# Patient Record
Sex: Female | Born: 1980 | Race: Black or African American | Hispanic: No | Marital: Married | Smoking: Never smoker
Health system: Southern US, Community
[De-identification: ages and names within clinical notes are randomized; demographics above are authoritative.]

## PROBLEM LIST (undated history)

## (undated) DIAGNOSIS — K219 Gastro-esophageal reflux disease without esophagitis: Secondary | ICD-10-CM

## (undated) DIAGNOSIS — I471 Supraventricular tachycardia, unspecified: Secondary | ICD-10-CM

## (undated) DIAGNOSIS — N83209 Unspecified ovarian cyst, unspecified side: Secondary | ICD-10-CM

## (undated) HISTORY — DX: Gastro-esophageal reflux disease without esophagitis: K21.9

---

## 2016-06-23 ENCOUNTER — Encounter (HOSPITAL_COMMUNITY): Payer: Self-pay | Admitting: Emergency Medicine

## 2016-06-23 ENCOUNTER — Emergency Department (HOSPITAL_COMMUNITY)
Admission: EM | Admit: 2016-06-23 | Discharge: 2016-06-23 | Disposition: A | Discharge status: Home / Self Care | Payer: BC Managed Care – PPO | Attending: Emergency Medicine | Admitting: Emergency Medicine

## 2016-06-23 DIAGNOSIS — R87619 Unspecified abnormal cytological findings in specimens from cervix uteri: Secondary | ICD-10-CM | POA: Diagnosis not present

## 2016-06-23 DIAGNOSIS — N939 Abnormal uterine and vaginal bleeding, unspecified: Secondary | ICD-10-CM | POA: Diagnosis present

## 2016-06-23 DIAGNOSIS — B9689 Other specified bacterial agents as the cause of diseases classified elsewhere: Secondary | ICD-10-CM | POA: Insufficient documentation

## 2016-06-23 DIAGNOSIS — Z91040 Latex allergy status: Secondary | ICD-10-CM | POA: Diagnosis not present

## 2016-06-23 DIAGNOSIS — N760 Acute vaginitis: Secondary | ICD-10-CM | POA: Diagnosis not present

## 2016-06-23 DIAGNOSIS — N888 Other specified noninflammatory disorders of cervix uteri: Secondary | ICD-10-CM

## 2016-06-23 HISTORY — DX: Unspecified ovarian cyst, unspecified side: N83.209

## 2016-06-23 HISTORY — DX: Supraventricular tachycardia: I47.1

## 2016-06-23 HISTORY — DX: Supraventricular tachycardia, unspecified: I47.10

## 2016-06-23 LAB — WET PREP, GENITAL
SPERM: NONE SEEN
Trich, Wet Prep: NONE SEEN
Yeast Wet Prep HPF POC: NONE SEEN

## 2016-06-23 LAB — POC URINE PREG, ED: Preg Test, Ur: NEGATIVE

## 2016-06-23 MED ORDER — METRONIDAZOLE 500 MG PO TABS: 500.0000 mg | ORAL_TABLET | Freq: Two times a day (BID) | ORAL | 0 refills | Status: DC

## 2016-06-23 NOTE — ED Notes (Signed)
MD completed pelvic exam

## 2016-06-23 NOTE — ED Triage Notes (Signed)
Pt states since shes been bleeding she stopped taking her birth control two weeks ago.

## 2016-06-23 NOTE — ED Triage Notes (Signed)
Pt states "i was diagnosed over a year ago with a large right ovarian cyst, they told me its getting better and its shrunk. I've been taking birth control and my periods are very irregular, been bleeding in between my periods. I felt in May my periods were regular. I moved memorial day weekend and i've been bleeding ever since. Its constant trickle. I've had pelvic pain, and some pain on my right side."

## 2016-06-23 NOTE — ED Provider Notes (Signed)
[facility] DEPT Provider Note   CSN: [id number] Arrival date & time: 06/23/16  0831     History   Chief Complaint Chief Complaint  Patient presents with  . Pelvic Pain  . Vaginal Bleeding    HPI Bethany Weaver is a 36 y.o. female.  HPI Patient were she has had irregular bleeding since [facility]. She had been on birth control before that but then discontinued it. She reports she discontinued it because she was having a lot of irregular bleeding and wasn't regulating it. (It was initially a little confusing as to when she stopped taking oral contraceptive. I initially had the impression she has stopped for a week around [facility] Day then restarted, triage note indicates she stopped 2 weeks ago. Patient clarifies for me that she had completely stopped around [facility]). She has had slight cramping discomfort. Cramping and pain has not been a significant feature of this symptom. She has had evaluation with her gynecologist reports she had a normal Pap smear recently. She reports she came to the emergency department today because the bleeding was a little heavier than some of her usual spotting and she wanted to make sure thing was okay. No lightheadedness, syncope, dyspnea. No history of anemia or transfusion. Patient is sexually active. Past Medical History:  Diagnosis Date  . Cyst, ovarian   . SVT (supraventricular tachycardia) (HCC)     There are no active problems to display for this patient.   History reviewed. No pertinent surgical history.  OB History    No data available       Home Medications    Prior to Admission medications   Medication Sig Start Date End Date Taking? Authorizing Provider  metroNIDAZOLE (FLAGYL) 500 MG tablet Take 1 tablet (500 mg total) by mouth 2 (two) times daily. One po bid x 7 days 06/23/16   Charlesetta Shanks, MD    Family History No family history on file.  Social History Social History  Substance Use Topics  . Smoking  status: Never Smoker  . Smokeless tobacco: Not on file  . Alcohol use Yes     Allergies   Latex   Review of Systems Review of Systems 10 Systems reviewed and are negative for acute change except as noted in the HPI.   Physical Exam Updated Vital Signs BP 114/68 (BP Location: Right Arm)   Pulse 71   Temp 98.4 F (36.9 C) (Oral)   Resp 16   Ht 5\' 3"  (1.6 m)   Wt 72.6 kg (160 lb)   LMP 06/23/2016   SpO2 100%   BMI 28.34 kg/m   Physical Exam  Constitutional: She is oriented to person, place, and time. She appears well-developed and well-nourished. No distress.  HENT:  Head: Normocephalic and atraumatic.  Eyes: Conjunctivae and EOM are normal.  Neck: Neck supple.  Cardiovascular: Normal rate and regular rhythm.   No murmur heard. Pulmonary/Chest: Effort normal and breath sounds normal. No respiratory distress.  Abdominal: Soft. She exhibits no distension. There is no tenderness.  Genitourinary:  Genitourinary Comments: Normal external female genitalia. Cervix has an approximately three-quarter centimeter cystic looking mass at 12:00. This is very friable and bleeds easily with swab. Small amount of creamy, bloody discharge pooled in the vault. No clot. Cervix is nontender.  Musculoskeletal: Normal range of motion. She exhibits no edema.  Neurological: She is alert and oriented to person, place, and time. No cranial nerve deficit. She exhibits normal muscle tone. Coordination normal.  Skin:  Skin is warm and dry.  Psychiatric: She has a normal mood and affect.  Nursing note and vitals reviewed.    ED Treatments / Results  Labs (all labs ordered are listed, but only abnormal results are displayed) Labs Reviewed  WET PREP, GENITAL - Abnormal; Notable for the following:       Result Value   Clue Cells Wet Prep HPF POC PRESENT (*)    WBC, Wet Prep HPF POC MANY (*)    All other components within normal limits  POC URINE PREG, ED  GC/CHLAMYDIA PROBE AMP ([place]) NOT  AT [facility]    EKG  EKG Interpretation None       Radiology No results found.  Procedures Procedures (including critical care time)  Medications Ordered in ED Medications - No data to display   Initial Impression / Assessment and Plan / ED Course  I have reviewed the triage vital signs and the nursing notes.  Pertinent labs & imaging results that were available during my care of the patient were reviewed by me and considered in my medical decision making (see chart for details).     Final Clinical Impressions(s) / ED Diagnoses   Final diagnoses:  Bacterial vaginosis  Abnormal vaginal bleeding  Friable cervix   Patient has had spotting and bleeding since May. She does not show any signs of being anemic. She has been off of birth control. pregnancy is negative. On examination there is a very friable, cystic looking structure on the cervix. Based on the patient's description of her bleeding I suspect this is more likely the bleeding source than actual hormonal dysfunctional uterine bleeding. She has fortunately had a normal Pap and been seen by gynecology regularly. At this time I do feel she is stable to continue her outpatient diagnostic evaluation and reassessment of this by gynecology to determine if this merits any biopsy or specific intervention. Incidentally patient does have clue cells thus I will opt to treat for bacterial vaginosis. I did have low suspicion for STI cervicitis. Will await cultures before empiric Antibiotic therapy. New Prescriptions New Prescriptions   METRONIDAZOLE (FLAGYL) 500 MG TABLET    Take 1 tablet (500 mg total) by mouth 2 (two) times daily. One po bid x 7 days     Charlesetta Shanks, MD 06/23/16 1209

## 2016-06-25 LAB — GC/CHLAMYDIA PROBE AMP (~~LOC~~) NOT AT ARMC
Chlamydia: NEGATIVE
Neisseria Gonorrhea: NEGATIVE

## 2016-12-20 DIAGNOSIS — R509 Fever, unspecified: Secondary | ICD-10-CM | POA: Diagnosis not present

## 2017-01-05 DIAGNOSIS — H6591 Unspecified nonsuppurative otitis media, right ear: Secondary | ICD-10-CM | POA: Diagnosis not present

## 2017-05-27 DIAGNOSIS — R0600 Dyspnea, unspecified: Secondary | ICD-10-CM | POA: Diagnosis not present

## 2017-05-27 DIAGNOSIS — R0789 Other chest pain: Secondary | ICD-10-CM | POA: Diagnosis not present

## 2017-05-27 DIAGNOSIS — R0602 Shortness of breath: Secondary | ICD-10-CM | POA: Diagnosis not present

## 2017-05-27 DIAGNOSIS — I471 Supraventricular tachycardia: Secondary | ICD-10-CM | POA: Diagnosis not present

## 2017-05-27 DIAGNOSIS — R002 Palpitations: Secondary | ICD-10-CM | POA: Diagnosis not present

## 2017-07-19 DIAGNOSIS — R140 Abdominal distension (gaseous): Secondary | ICD-10-CM | POA: Diagnosis not present

## 2017-07-19 DIAGNOSIS — K5900 Constipation, unspecified: Secondary | ICD-10-CM | POA: Diagnosis not present

## 2017-07-19 DIAGNOSIS — R109 Unspecified abdominal pain: Secondary | ICD-10-CM | POA: Diagnosis not present

## 2017-07-24 DIAGNOSIS — R1084 Generalized abdominal pain: Secondary | ICD-10-CM | POA: Diagnosis not present

## 2017-07-24 DIAGNOSIS — Z13220 Encounter for screening for lipoid disorders: Secondary | ICD-10-CM | POA: Diagnosis not present

## 2017-07-24 DIAGNOSIS — Z0000 Encounter for general adult medical examination without abnormal findings: Secondary | ICD-10-CM | POA: Diagnosis not present

## 2017-07-24 DIAGNOSIS — E559 Vitamin D deficiency, unspecified: Secondary | ICD-10-CM | POA: Diagnosis not present

## 2017-07-24 DIAGNOSIS — K5900 Constipation, unspecified: Secondary | ICD-10-CM | POA: Diagnosis not present

## 2017-07-24 DIAGNOSIS — Z131 Encounter for screening for diabetes mellitus: Secondary | ICD-10-CM | POA: Diagnosis not present

## 2017-07-24 DIAGNOSIS — R1319 Other dysphagia: Secondary | ICD-10-CM | POA: Diagnosis not present

## 2017-07-24 DIAGNOSIS — R194 Change in bowel habit: Secondary | ICD-10-CM | POA: Diagnosis not present

## 2017-08-07 DIAGNOSIS — R194 Change in bowel habit: Secondary | ICD-10-CM | POA: Diagnosis not present

## 2017-08-07 DIAGNOSIS — K222 Esophageal obstruction: Secondary | ICD-10-CM | POA: Diagnosis not present

## 2017-08-07 DIAGNOSIS — K635 Polyp of colon: Secondary | ICD-10-CM | POA: Diagnosis not present

## 2017-08-07 DIAGNOSIS — R1319 Other dysphagia: Secondary | ICD-10-CM | POA: Diagnosis not present

## 2017-08-07 DIAGNOSIS — F458 Other somatoform disorders: Secondary | ICD-10-CM | POA: Diagnosis not present

## 2017-08-07 DIAGNOSIS — D120 Benign neoplasm of cecum: Secondary | ICD-10-CM | POA: Diagnosis not present

## 2017-08-07 DIAGNOSIS — R1084 Generalized abdominal pain: Secondary | ICD-10-CM | POA: Diagnosis not present

## 2017-08-07 DIAGNOSIS — R1310 Dysphagia, unspecified: Secondary | ICD-10-CM | POA: Diagnosis not present

## 2018-03-06 ENCOUNTER — Ambulatory Visit (INDEPENDENT_AMBULATORY_CARE_PROVIDER_SITE_OTHER): Payer: BLUE CROSS/BLUE SHIELD | Admitting: Obstetrics and Gynecology

## 2018-03-06 ENCOUNTER — Encounter: Payer: Self-pay | Admitting: Obstetrics and Gynecology

## 2018-03-06 VITALS — BP 101/69 | HR 84 | Wt 185.5 lb

## 2018-03-06 DIAGNOSIS — Z01419 Encounter for gynecological examination (general) (routine) without abnormal findings: Secondary | ICD-10-CM

## 2018-03-06 DIAGNOSIS — Z1151 Encounter for screening for human papillomavirus (HPV): Secondary | ICD-10-CM

## 2018-03-06 DIAGNOSIS — Z113 Encounter for screening for infections with a predominantly sexual mode of transmission: Secondary | ICD-10-CM | POA: Diagnosis not present

## 2018-03-06 DIAGNOSIS — Z0000 Encounter for general adult medical examination without abnormal findings: Secondary | ICD-10-CM

## 2018-03-06 DIAGNOSIS — M419 Scoliosis, unspecified: Secondary | ICD-10-CM | POA: Insufficient documentation

## 2018-03-06 DIAGNOSIS — K5909 Other constipation: Secondary | ICD-10-CM

## 2018-03-06 DIAGNOSIS — Z23 Encounter for immunization: Secondary | ICD-10-CM | POA: Diagnosis not present

## 2018-03-06 DIAGNOSIS — Z124 Encounter for screening for malignant neoplasm of cervix: Secondary | ICD-10-CM

## 2018-03-06 DIAGNOSIS — N8110 Cystocele, unspecified: Secondary | ICD-10-CM | POA: Insufficient documentation

## 2018-03-06 NOTE — Progress Notes (Signed)
GYNECOLOGY ANNUAL PREVENTATIVE CARE ENCOUNTER NOTE  History:     Bethany Weaver is a 38 y.o. No obstetric history on file. female here for a routine annual gynecologic exam.  Current complaints: None.   Denies abnormal vaginal bleeding, discharge, pelvic pain, problems with intercourse or other gynecologic concerns. History of abnormal vaginal bleeding; was told she has a fibroid.  No bleeding currently.   Gynecologic History Patient's last menstrual period was 02/21/2018 (exact date). Contraception: None  Last Pap: April 2018. Results were: normal with negative HPV Last mammogram: NA, Will order today due to history of cancer in the family. Ovarian, liver, prostate CA in the family.   Obstetric History OB History  No obstetric history on file.    Past Medical History:  Diagnosis Date  . Cyst, ovarian   . SVT (supraventricular tachycardia) (HCC)     No past surgical history on file.  Current Outpatient Medications on File Prior to Visit  Medication Sig Dispense Refill  . metroNIDAZOLE (FLAGYL) 500 MG tablet Take 1 tablet (500 mg total) by mouth 2 (two) times daily. One po bid x 7 days (Patient not taking: Reported on 03/06/2018) 14 tablet 0   No current facility-administered medications on file prior to visit.     Allergies  Allergen Reactions  . Latex Hives and Itching    Social History:  reports that she has never smoked. She does not have any smokeless tobacco history on file. She reports current alcohol use.  No family history on file.  The following portions of the patient's history were reviewed and updated as appropriate: allergies, current medications, past family history, past medical history, past social history, past surgical history and problem list.  Review of Systems Pertinent items noted in HPI and remainder of comprehensive ROS otherwise negative.  Physical Exam:  BP 101/69   Pulse 84   Wt 185 lb 8 oz (84.1 kg)   LMP 02/21/2018 (Exact Date)    BMI 32.86 kg/m  CONSTITUTIONAL: Well-developed, well-nourished female in no acute distress.  HENT:  Normocephalic, atraumatic, External right and left ear normal. Oropharynx is clear and moist EYES: Conjunctivae and EOM are normal. Pupils are equal, round, and reactive to light. No scleral icterus.  NECK: Normal range of motion, supple, no masses.  Normal thyroid.  SKIN: Skin is warm and dry. No rash noted. Not diaphoretic. No erythema. No pallor. MUSCULOSKELETAL: Normal range of motion. No tenderness.  No cyanosis, clubbing, or edema.  2+ distal pulses. NEUROLOGIC: Alert and oriented to person, place, and time. Normal reflexes, muscle tone coordination. No cranial nerve deficit noted. PSYCHIATRIC: Normal mood and affect. Normal behavior. Normal judgment and thought content. CARDIOVASCULAR: Normal heart rate noted, regular rhythm RESPIRATORY: Clear to auscultation bilaterally. Effort and breath sounds normal, no problems with respiration noted. BREASTS: Symmetric in size. No masses, skin changes, nipple drainage, or lymphadenopathy. ABDOMEN: Soft, normal bowel sounds, no distention noted.  No tenderness, rebound or guarding.  PELVIC: Normal appearing external genitalia; normal appearing vaginal mucosa and cervix.  No abnormal discharge noted.  Pap smear obtained.  Normal uterine size, no other palpable masses, no uterine or adnexal tenderness. Grade 1 cystocele   Assessment and Plan:   1. Annual physical exam  - patient requests Pap today despite normal 2 years ago. She is aware insurance may        not cover. Recommendation is every 3 years.  - MM Digital Screening; Future - HIV antibody (with reflex) - Cytology -  PAP( [facility]) - Flu Vaccine QUAD 36+ mos IM (Fluarix, Quad PF)  2. Scoliosis, unspecified scoliosis type, unspecified spinal region  PCP to manage   3. Chronic constipation    4. Mild, Grade 1 cystocele   There are no diagnoses linked to this encounter. Will  follow up results of pap smear and manage accordingly. Mammogram scheduled Routine preventative health maintenance measures emphasized. Please refer to After Visit Summary for other counseling recommendations.     Taliah Porche, Artist Pais, [place] for [employer], [place]

## 2018-03-07 LAB — HIV ANTIBODY (ROUTINE TESTING W REFLEX): HIV Screen 4th Generation wRfx: NONREACTIVE

## 2018-03-11 LAB — CYTOLOGY - PAP
CHLAMYDIA, DNA PROBE: NEGATIVE
Diagnosis: NEGATIVE
HPV (WINDOPATH): NOT DETECTED
NEISSERIA GONORRHEA: NEGATIVE
TRICH (WINDOWPATH): NEGATIVE

## 2018-04-09 ENCOUNTER — Other Ambulatory Visit: Payer: Self-pay | Admitting: Obstetrics and Gynecology

## 2018-04-09 DIAGNOSIS — Z1231 Encounter for screening mammogram for malignant neoplasm of breast: Secondary | ICD-10-CM

## 2018-04-14 ENCOUNTER — Telehealth: Payer: Self-pay | Admitting: Family Medicine

## 2018-04-18 ENCOUNTER — Other Ambulatory Visit: Payer: Self-pay

## 2018-04-18 MED ORDER — NORGESTIMATE-ETH ESTRADIOL 0.25-35 MG-MCG PO TABS: 1.0000 | ORAL_TABLET | Freq: Every day | ORAL | 2 refills | Status: DC

## 2018-05-07 NOTE — Telephone Encounter (Signed)
Opened in error

## 2018-06-09 ENCOUNTER — Ambulatory Visit: Payer: BLUE CROSS/BLUE SHIELD

## 2018-08-15 ENCOUNTER — Telehealth: Payer: Self-pay

## 2018-08-15 DIAGNOSIS — Z3041 Encounter for surveillance of contraceptive pills: Secondary | ICD-10-CM

## 2018-08-15 MED ORDER — NORGESTIMATE-ETH ESTRADIOL 0.25-35 MG-MCG PO TABS: 1.0000 | ORAL_TABLET | Freq: Every day | ORAL | 11 refills | Status: DC

## 2018-08-15 NOTE — Telephone Encounter (Signed)
Pt called requesting a refill on her BCP.  Per Dr. Kennon Rounds pt can have 11 refills on BCP.  Pt informed to go to her [facility] to pick up medication.  Pt verbalized understanding.

## 2018-11-03 DIAGNOSIS — M7732 Calcaneal spur, left foot: Secondary | ICD-10-CM | POA: Diagnosis not present

## 2018-11-03 DIAGNOSIS — M722 Plantar fascial fibromatosis: Secondary | ICD-10-CM | POA: Diagnosis not present

## 2018-11-03 DIAGNOSIS — M71572 Other bursitis, not elsewhere classified, left ankle and foot: Secondary | ICD-10-CM | POA: Diagnosis not present

## 2018-12-08 DIAGNOSIS — M545 Low back pain: Secondary | ICD-10-CM | POA: Diagnosis not present

## 2018-12-08 DIAGNOSIS — M546 Pain in thoracic spine: Secondary | ICD-10-CM | POA: Diagnosis not present

## 2018-12-08 DIAGNOSIS — M419 Scoliosis, unspecified: Secondary | ICD-10-CM | POA: Diagnosis not present

## 2018-12-10 ENCOUNTER — Encounter: Payer: Self-pay | Admitting: Gastroenterology

## 2018-12-10 ENCOUNTER — Other Ambulatory Visit: Payer: Self-pay

## 2018-12-10 ENCOUNTER — Other Ambulatory Visit (INDEPENDENT_AMBULATORY_CARE_PROVIDER_SITE_OTHER): Payer: BC Managed Care – PPO

## 2018-12-10 ENCOUNTER — Ambulatory Visit (INDEPENDENT_AMBULATORY_CARE_PROVIDER_SITE_OTHER): Payer: BC Managed Care – PPO | Admitting: Gastroenterology

## 2018-12-10 VITALS — BP 120/80 | HR 68 | Temp 97.4°F | Ht 63.0 in | Wt 180.0 lb

## 2018-12-10 DIAGNOSIS — K5909 Other constipation: Secondary | ICD-10-CM

## 2018-12-10 DIAGNOSIS — R1011 Right upper quadrant pain: Secondary | ICD-10-CM

## 2018-12-10 DIAGNOSIS — R1031 Right lower quadrant pain: Secondary | ICD-10-CM

## 2018-12-10 DIAGNOSIS — R1319 Other dysphagia: Secondary | ICD-10-CM

## 2018-12-10 DIAGNOSIS — R1310 Dysphagia, unspecified: Secondary | ICD-10-CM

## 2018-12-10 LAB — CBC WITH DIFFERENTIAL/PLATELET
Basophils Absolute: 0 10*3/uL (ref 0.0–0.1)
Basophils Relative: 0.4 % (ref 0.0–3.0)
Eosinophils Absolute: 0.1 10*3/uL (ref 0.0–0.7)
Eosinophils Relative: 0.8 % (ref 0.0–5.0)
HCT: 37.9 % (ref 36.0–46.0)
Hemoglobin: 12.5 g/dL (ref 12.0–15.0)
Lymphocytes Relative: 18.9 % (ref 12.0–46.0)
Lymphs Abs: 1.5 10*3/uL (ref 0.7–4.0)
MCHC: 32.9 g/dL (ref 30.0–36.0)
MCV: 87.1 fl (ref 78.0–100.0)
Monocytes Absolute: 0.4 10*3/uL (ref 0.1–1.0)
Monocytes Relative: 5.7 % (ref 3.0–12.0)
Neutro Abs: 5.8 10*3/uL (ref 1.4–7.7)
Neutrophils Relative %: 74.2 % (ref 43.0–77.0)
Platelets: 337 10*3/uL (ref 150.0–400.0)
RBC: 4.35 Mil/uL (ref 3.87–5.11)
RDW: 14 % (ref 11.5–15.5)
WBC: 7.8 10*3/uL (ref 4.0–10.5)

## 2018-12-10 LAB — COMPREHENSIVE METABOLIC PANEL
ALT: 23 U/L (ref 0–35)
AST: 16 U/L (ref 0–37)
Albumin: 4.2 g/dL (ref 3.5–5.2)
Alkaline Phosphatase: 44 U/L (ref 39–117)
BUN: 11 mg/dL (ref 6–23)
CO2: 28 mEq/L (ref 19–32)
Calcium: 8.9 mg/dL (ref 8.4–10.5)
Chloride: 103 mEq/L (ref 96–112)
Creatinine, Ser: 0.81 mg/dL (ref 0.40–1.20)
GFR: 94.93 mL/min (ref >60.00)
Glucose, Bld: 81 mg/dL (ref 70–99)
Potassium: 3.5 mEq/L (ref 3.5–5.1)
Sodium: 139 mEq/L (ref 135–145)
Total Bilirubin: 0.4 mg/dL (ref 0.2–1.2)
Total Protein: 7.4 g/dL (ref 6.0–8.3)

## 2018-12-10 LAB — TSH: TSH: 1.54 u[IU]/mL (ref 0.35–4.50)

## 2018-12-10 NOTE — Progress Notes (Addendum)
[facility] Consult Note:  History: Bethany Weaver 12/10/2018  Referring provider: Self-referred, no local PCP  Reason for consult/chief complaint: Abdominal Pain (Pain occurs after meals, bloating, pressure, gas mostly on right side) and Dysphagia (Food lodged in throat)   Subjective  HPI:  This is a very pleasant 38 year old woman self-referred for multiple GI symptoms.  The most bothersome thing recently has been several weeks of right upper quadrant pressure and bloating that gets worse after meals.  Sometimes it feels that pressure goes into her chest.  It is not exertional.  She tends toward constipation, though it seems to be intermittent where she might have regular bowel movement every day for a week, then no BM for several days.  In the past, that would be relieved with a senna-containing tea, but that no longer seems to be working as well.  She previously had dysphagia with food feeling stuck in the chest, and saw a GI physician in [place] summer 2019 for that in the constipation.  She reports a colonoscopy with polypectomy and upper endoscopy with dilation.  Those reports were not available today, but she leaves she can access them through the portal she still has with that practice.  Sometimes she will have nausea if she drinks too much liquid with meals, denies vomiting, odynophagia, rectal bleeding or weight loss.  Lastly, she has had years of a dull fairly constant RLQ discomfort that was previously attributed to an ovarian cyst and evaluated by gynecology.  She was told it was a functional cyst, with symptoms that would come and go with her cycle.  It seems to be more constant though low-grade in the last year or so.  She saw gynecology since moving here in August 2019, does not recall any pelvic imaging being done, though had apparently been done by her prior GYN practice.  Also has not yet established with a local primary care.  ROS:  Review of  Systems  Constitutional: Negative for appetite change and unexpected weight change.  HENT: Negative for mouth sores and voice change.   Eyes: Negative for pain and redness.  Respiratory: Negative for cough and shortness of breath.   Cardiovascular: Negative for chest pain and palpitations.  Genitourinary: Negative for dysuria and hematuria.  Musculoskeletal: Negative for arthralgias and myalgias.  Skin: Negative for pallor and rash.  Neurological: Negative for weakness and headaches.  Hematological: Negative for adenopathy.     Past Medical History: Past Medical History:  Diagnosis Date  . Cyst, ovarian   . SVT (supraventricular tachycardia) (HCC)      Past Surgical History: History reviewed. No pertinent surgical history.   Family History: Family History  Problem Relation Age of Onset  . Hypotension Mother   . Heart disease Father   . Diabetes Father     Social History: Social History   Socioeconomic History  . Marital status: Married    Spouse name: Not on file  . Number of children: Not on file  . Years of education: Not on file  . Highest education level: Not on file  Occupational History  . Not on file  Social Needs  . Financial resource strain: Not on file  . Food insecurity    Worry: Not on file    Inability: Not on file  . Transportation needs    Medical: Not on file    Non-medical: Not on file  Tobacco Use  . Smoking status: Never Smoker  . Smokeless tobacco: Never Used  Substance and Sexual Activity  . Alcohol use: Yes  . Drug use: Never  . Sexual activity: Yes    Birth control/protection: Pill  Lifestyle  . Physical activity    Days per week: Not on file    Minutes per session: Not on file  . Stress: Not on file  Relationships  . Social Herbalist on phone: Not on file    Gets together: Not on file    Attends religious service: Not on file    Active member of club or organization: Not on file    Attends meetings of clubs or  organizations: Not on file    Relationship status: Not on file  Other Topics Concern  . Not on file  Social History Narrative  . Not on file    Allergies: Allergies  Allergen Reactions  . Latex Hives and Itching    Outpatient Meds: Current Outpatient Medications  Medication Sig Dispense Refill  . norgestimate-ethinyl estradiol (ORTHO-CYCLEN) 0.25-35 MG-MCG tablet Take 1 tablet by mouth daily. 1 Package 11   No current facility-administered medications for this visit.       ___________________________________________________________________ Objective   Exam:  BP 120/80   Pulse 68   Temp (!) 97.4 F (36.3 C)   Ht 5\' 3"  (1.6 m)   Wt 180 lb (81.6 kg)   LMP 11/25/2018 (Approximate)   BMI 31.89 kg/m    General: Well-appearing  Eyes: sclera anicteric, no redness  ENT: oral mucosa moist without lesions, no cervical or supraclavicular lymphadenopathy  CV: RRR without murmur, S1/S2, no JVD, no peripheral edema  Resp: clear to auscultation bilaterally, normal RR and effort noted  GI: soft, no tenderness, with active bowel sounds. No guarding or palpable organomegaly noted.  Skin; warm and dry, no rash or jaundice noted  Neuro: awake, alert and oriented x 3. Normal gross motor function and fluent speech  No data for review  Assessment: Encounter Diagnoses  Name Primary?  . RUQ pain Yes  . Chronic constipation   . RLQ abdominal pain   . Esophageal dysphagia     Chronic symptoms of esophageal dysphagia and constipation with prior endoscopic work-up in [place].  I asked her to get me those reports.  The RLQ pain may be from the reported ovarian cyst.  The more recent and bothersome symptom of the right upper quadrant pain is somewhat difficult to characterize.  She reports multiple family members with gallbladder trouble.  Plan:  CBC, CMP, TSH today Abdominal ultrasound.   Nelida Meuse III  CC: Referring provider noted above  Record review  addendum 12/18/2018:  Patient provided a summary of her endoscopic procedures at [facility] surgical center in [facility] by Dr. Sundra Aland 08/07/2017 These are not the full procedure reports, but some black-and-white photographs and summary of findings. EGD performed for dysphagia and globus sensation.  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  4-6 mm cecal hyperplastic polyp  Office note 07/24/17 reporting chronic abdominal pain and dysphagia and constipation  H. Loletha Carrow, MD

## 2018-12-10 NOTE — Patient Instructions (Signed)
If you are age 39 or older, your body mass index should be between 23-30. Your Body mass index is 31.89 kg/m. If this is out of the aforementioned range listed, please consider follow up with your Primary Care Provider.  If you are age 13 or younger, your body mass index should be between 19-25. Your Body mass index is 31.89 kg/m. If this is out of the aformentioned range listed, please consider follow up with your Primary Care Provider.   Your provider has requested that you go to the basement level for lab work before leaving today. Press "B" on the elevator. The lab is located at the first door on the left as you exit the elevator.  You have been scheduled for an abdominal ultrasound at [facility] (1st floor of hospital) on 12-16-2018 at 945am. Please arrive 15 minutes prior to your appointment for registration. Make certain not to have anything to eat or drink 6 hours prior to your appointment. Should you need to reschedule your appointment, please contact radiology at [phone]. This test typically takes about 30 minutes to perform.  It was a pleasure to see you today!  Dr. Loletha Carrow

## 2018-12-16 ENCOUNTER — Encounter: Payer: Self-pay | Admitting: Student

## 2018-12-16 ENCOUNTER — Ambulatory Visit (INDEPENDENT_AMBULATORY_CARE_PROVIDER_SITE_OTHER): Payer: BC Managed Care – PPO | Admitting: Student

## 2018-12-16 ENCOUNTER — Ambulatory Visit (HOSPITAL_COMMUNITY)
Admission: RE | Admit: 2018-12-16 | Discharge: 2018-12-16 | Disposition: A | Discharge status: Home / Self Care | Payer: BC Managed Care – PPO | Source: Ambulatory Visit | Attending: Gastroenterology | Admitting: Gastroenterology

## 2018-12-16 ENCOUNTER — Other Ambulatory Visit: Payer: Self-pay

## 2018-12-16 VITALS — BP 118/80 | HR 67 | Ht 63.0 in | Wt 179.1 lb

## 2018-12-16 DIAGNOSIS — K5909 Other constipation: Secondary | ICD-10-CM | POA: Diagnosis not present

## 2018-12-16 DIAGNOSIS — R109 Unspecified abdominal pain: Secondary | ICD-10-CM | POA: Diagnosis not present

## 2018-12-16 DIAGNOSIS — N83201 Unspecified ovarian cyst, right side: Secondary | ICD-10-CM

## 2018-12-16 DIAGNOSIS — R102 Pelvic and perineal pain: Secondary | ICD-10-CM

## 2018-12-16 DIAGNOSIS — R1031 Right lower quadrant pain: Secondary | ICD-10-CM

## 2018-12-16 DIAGNOSIS — R1011 Right upper quadrant pain: Secondary | ICD-10-CM | POA: Diagnosis not present

## 2018-12-16 DIAGNOSIS — Z8041 Family history of malignant neoplasm of ovary: Secondary | ICD-10-CM | POA: Insufficient documentation

## 2018-12-16 LAB — POCT URINALYSIS DIP (DEVICE)
Bilirubin Urine: NEGATIVE
Glucose, UA: NEGATIVE mg/dL
Ketones, ur: NEGATIVE mg/dL
Leukocytes,Ua: NEGATIVE
Nitrite: NEGATIVE
Protein, ur: NEGATIVE mg/dL
Specific Gravity, Urine: 1.02 (ref 1.005–1.030)
Urobilinogen, UA: 0.2 mg/dL (ref 0.0–1.0)
pH: 8.5 — ABNORMAL HIGH (ref 5.0–8.0)

## 2018-12-16 NOTE — Progress Notes (Signed)
History:  [name] is a 38 y.o. G2P0 who presents to clinic today for concern for ovarian cyst and occasional spotting on her BCP. She was diagnosed with an 8-9 cm cysts three years ago in [place]; it was followed up by her ob-gyn. She did not have surgery; "it got smaller" over time. Over the years, she has felt it "come and go" but it has gotten worse in November. She almost went to the ED for this. It is now spreading to the back and to her flank. She feels a constant pain in her RLQ that is now spreading to her back.   Patient denies STI symptoms today; denies abnormal vaginal discharge, pain with intercourse or other ob-gyn complaints.   She also complains of increased bloating, stomach upset after eating. She was seen in in [facility] July 2019  at a GI center after she noticed more bloating and a feeling like she had an upset stomach after every meal and struggling to feel like she has food in her throat.  She had an upper and lower endoscopy; no diagnosis was given, although she had a polyp removed from her colon. Per patient, no signs of GERD, ulcers. She had a "dilation" and then she felt better.   She saw [facility] on 12-2 because the symptoms of bloating and fullness came back, and she was having Right-sided pain, She was concerned it was her gall bladder or a kidney stone. [facility] did blood work and ordered an abdominal US (carried out today). They told her to  Follow up with her ob-gyn. They will call her with the results of her abdominal scan.     The following portions of the patient's history were reviewed and updated as appropriate: allergies, current medications, family history, past medical history, social history, past surgical history and problem list.  Review of Systems:  Review of Systems  Constitutional: Negative.   HENT: Negative.   Respiratory: Negative.   Cardiovascular: Negative.   Gastrointestinal: Positive for abdominal pain and  constipation.  Genitourinary: Positive for flank pain.  Skin: Negative.   Neurological: Negative.       Objective:  Physical Exam BP 118/80   Pulse 67   Ht 5\' 3"  (1.6 m)   Wt 179 lb 1.6 oz (81.2 kg)   LMP 11/25/2018 (Approximate)   BMI 31.73 kg/m  Physical Exam  Constitutional: She appears well-developed.  HENT:  Head: Normocephalic.  Eyes: Pupils are equal, round, and reactive to light.  Neck: Normal range of motion.  Respiratory: Effort normal.  GI: Soft.  Musculoskeletal: Normal range of motion.  Neurological: She is alert.  Skin: Skin is warm and dry.  Psychiatric: She has a normal mood and affect.  Uterus and ovaries and cervix feel normal, no masses palpated on either ovary or adnexa. No CMT.     Labs and Imaging Results for orders placed or performed in visit on 12/16/18 (from the past 24 hour(s))  POCT urinalysis dip (device)     Status: Abnormal   Collection Time: 12/16/18 11:41 AM  Result Value Ref Range   Glucose, UA NEGATIVE NEGATIVE mg/dL   Bilirubin Urine NEGATIVE NEGATIVE   Ketones, ur NEGATIVE NEGATIVE mg/dL   Specific Gravity, Urine 1.020 1.005 - 1.030   Hgb urine dipstick SMALL (A) NEGATIVE   pH 8.5 (H) 5.0 - 8.0   Protein, ur NEGATIVE NEGATIVE mg/dL   Urobilinogen, UA 0.2 0.0 - 1.0 mg/dL   Nitrite NEGATIVE NEGATIVE   Leukocytes,Ua  NEGATIVE NEGATIVE    No results found.   Assessment & Plan:  There are no diagnoses linked to this encounter. 1. Pelvic pain   2. Family history of ovarian cancer   3. Cyst of right ovary    -patient scheduled for outpatient [place] and follow-up with MD.  -declined STI testing and vaginal cultures.  -at this point, no change in birth control.  -consider CA125 after she talks with MD -urine culture not sent as patient has benign UA -consider urology follow up if GI work up  and OB work up findings are benign.   Starr Lake, [place] 12/16/2018 12:34 PM

## 2018-12-18 ENCOUNTER — Ambulatory Visit: Payer: BC Managed Care – PPO | Admitting: Obstetrics and Gynecology

## 2018-12-23 ENCOUNTER — Other Ambulatory Visit: Payer: Self-pay | Admitting: *Deleted

## 2018-12-23 MED ORDER — HYOSCYAMINE SULFATE 0.125 MG PO TABS: 0.1250 mg | ORAL_TABLET | Freq: Three times a day (TID) | ORAL | 0 refills | Status: DC | PRN

## 2018-12-24 ENCOUNTER — Other Ambulatory Visit: Payer: Self-pay

## 2018-12-24 ENCOUNTER — Ambulatory Visit (HOSPITAL_COMMUNITY)
Admission: RE | Admit: 2018-12-24 | Discharge: 2018-12-24 | Disposition: A | Discharge status: Home / Self Care | Payer: BC Managed Care – PPO | Source: Ambulatory Visit | Attending: Student | Admitting: Student

## 2018-12-24 DIAGNOSIS — Z8041 Family history of malignant neoplasm of ovary: Secondary | ICD-10-CM | POA: Diagnosis not present

## 2018-12-24 DIAGNOSIS — D259 Leiomyoma of uterus, unspecified: Secondary | ICD-10-CM | POA: Diagnosis not present

## 2018-12-31 ENCOUNTER — Other Ambulatory Visit: Payer: Self-pay

## 2018-12-31 ENCOUNTER — Telehealth (INDEPENDENT_AMBULATORY_CARE_PROVIDER_SITE_OTHER): Payer: BC Managed Care – PPO | Admitting: Obstetrics and Gynecology

## 2018-12-31 DIAGNOSIS — R319 Hematuria, unspecified: Secondary | ICD-10-CM | POA: Diagnosis not present

## 2018-12-31 DIAGNOSIS — R102 Pelvic and perineal pain: Secondary | ICD-10-CM

## 2018-12-31 NOTE — Progress Notes (Signed)
I connected with  Bethany Weaver on 12/31/18 at  4:15 PM EST by telephone and verified that I am speaking with the correct person using two identifiers.   I discussed the limitations, risks, security and privacy concerns of performing an evaluation and management service by telephone and the availability of in person appointments. I also discussed with the patient that there may be a patient responsible charge related to this service. The patient expressed understanding and agreed to proceed.  Annabell Howells, RN 12/31/2018  4:16 PM

## 2018-12-31 NOTE — Progress Notes (Signed)
Obstetrics and Gynecology Established Patient Evaluation Virtual Visit  Appointment Date: 12/31/2018  OBGYN Clinic: [facility]  Primary Care Provider: System, Provider Not In  Referring Provider: Maye Hides, CNM  I connected with Bethany Weaver on 12/31/18 at  4:15 PM EST by telephone at home and verified that I am speaking with the correct person using two identifiers.   I discussed the limitations, risks, security and privacy concerns of performing an evaluation and management service by telephone and the availability of in person appointments. I also discussed with the patient that there may be a patient responsible charge related to this service. The patient expressed understanding and agreed to proceed.  Chief Complaint: follow up u/s results  History of Present Illness: Bethany Weaver is a 38 y.o. African-American P2 (LMP 12/13), seen for the above chief complaint.  Patient seen on 12/8 by [facility] for concern for ovarian cysts, h/o pelvic pain. Ultrasound ordered and patient referred to me for follow up  She had a poc u/a which showed blood and pH of 8.5, and a normal cbc/cmp/tsh. Ultrasound results only showed a few 1cm fibroids, one being submucosal (see below).   Patient states she has a long history of ovarian cysts and was followed by a GYN in [place] before moving to this area about 2-3 years ago; at that time she had a cyst that was about 10cm. She was told it was a functional cyst which would go away with time, which it eventually did.   Currently, she states her rlq/flank pain has subsided greatly and that it did feel similar to how it did a few years ago.   She states she has a long history of hematuria and was going to be referred to a Urologist by her GYN in [place] but moved to this area before a referral could be done.   No dysuria, smell to her urine, hematuria, AUB.   Review of Systems:as noted in the History of Present  Illness.  Past Medical History:  Past Medical History:  Diagnosis Date  . Cyst, ovarian   . SVT (supraventricular tachycardia) (HCC)     Past Surgical History:  No past surgical history on file.  Past Obstetrical History:  OB History  Gravida Para Term Preterm AB Living  2         2  SAB TAB Ectopic Multiple Live Births          2    # Outcome Date GA Lbr Len/2nd Weight Sex Delivery Anes PTL Lv  2 Gravida           1 Gravida             SVD x 2  Past Gynecological History: As per HPI. History of Pap Smear(s): Yes.   Last pap 2020, which was negative She is currently using oral contraceptives (estrogen/progesterone) for contraception.   Social History:  Social History   Socioeconomic History  . Marital status: Married    Spouse name: Not on file  . Number of children: Not on file  . Years of education: Not on file  . Highest education level: Not on file  Occupational History  . Not on file  Tobacco Use  . Smoking status: Never Smoker  . Smokeless tobacco: Never Used  Substance and Sexual Activity  . Alcohol use: Yes  . Drug use: Never  . Sexual activity: Yes    Birth control/protection: Pill  Other Topics Concern  . Not  on file  Social History Narrative  . Not on file   Social Determinants of Health   Financial Resource Strain:   . Difficulty of Paying Living Expenses: Not on file  Food Insecurity:   . Worried About Charity fundraiser in the Last Year: Not on file  . Ran Out of Food in the Last Year: Not on file  Transportation Needs:   . Lack of Transportation (Medical): Not on file  . Lack of Transportation (Non-Medical): Not on file  Physical Activity:   . Days of Exercise per Week: Not on file  . Minutes of Exercise per Session: Not on file  Stress:   . Feeling of Stress : Not on file  Social Connections:   . Frequency of Communication with Friends and Family: Not on file  . Frequency of Social Gatherings with Friends and Family: Not on file   . Attends Religious Services: Not on file  . Active Member of Clubs or Organizations: Not on file  . Attends Archivist Meetings: Not on file  . Marital Status: Not on file  Intimate Partner Violence:   . Fear of Current or Ex-Partner: Not on file  . Emotionally Abused: Not on file  . Physically Abused: Not on file  . Sexually Abused: Not on file    Family History:  Family History  Problem Relation Age of Onset  . Hypotension Mother   . Heart disease Father   . Diabetes Father    Medications Bethany Weaver "Bethany Weaver" had no medications administered during this visit. Current Outpatient Medications  Medication Sig Dispense Refill  . norgestimate-ethinyl estradiol (ORTHO-CYCLEN) 0.25-35 MG-MCG tablet Take 1 tablet by mouth daily. 1 Package 11  . hyoscyamine (LEVSIN) 0.125 MG tablet Take 1 tablet (0.125 mg total) by mouth every 8 (eight) hours as needed (abdominal pain). (Patient not taking: Reported on 12/31/2018) 20 tablet 0   No current facility-administered medications for this visit.    Allergies Latex   Physical Exam:  Virtual visit  General appearance: Well nourished, well developed female in no acute distress.    Laboratory: as per HPI  Radiology:  CLINICAL DATA:  Evaluate endometrial lining and ovarian cyst, family history of ovarian cancer  EXAM: TRANSABDOMINAL ULTRASOUND OF PELVIS  TECHNIQUE: Transabdominal ultrasound examination of the pelvis was performed including evaluation of the uterus, ovaries, adnexal regions, and pelvic cul-de-sac. Transabdominal imaging was not ordered.  COMPARISON:  None.  FINDINGS: Uterus  Measurements: 11.8 x 6.1 x 6.2 cm = volume: 235 mL. Anteverted. Mildly heterogeneous myometrial echogenicity. Three uterine nodules consistent with leiomyomata. These include a 2.1 x 1.6 x 1.8 cm diameter submucosal lesion at the anterior upper to mid uterus, a 2.2 x 1.7 x 2.6 cm intramural leiomyoma at  the mid uterus, and a 1.7 x 1.7 x 1.5 cm intramural lesion at the posterior upper uterus.  Endometrium  Thickness: 7 mm.  No endometrial fluid or focal abnormality  Right ovary  Measurements: 3.8 x 2.6 x 2.4 cm = volume: 12.7 mL. Normal morphology without mass.  Left ovary  Measurements: 3.4 x 2.6 x 1.7 cm = volume: 7.9 mL. Dominant follicle without mass  Other findings:  No free pelvic fluid or adnexal masses.  IMPRESSION: Multiple small leiomyomata as above, 1 of which is submucosal in position.  Remainder of exam unremarkable.   Electronically Signed   By: Lavonia Dana M.D.   On: 12/24/2018 16:06  Assessment: pt improving  Plan:  1. Hematuria,  unspecified type Recommend coming in for formal u/a and culture and if negative then recommend referral to Urology. If +, recommend tx for infection and re-test after that  - Urinalysis, Routine w reflex microscopic - Urine Culture-GYN  2. History of pelvic pain Normal exam with CNM and u/s negative. It sounds like based on her period tracking the pain may have been mid point in her cycle. I told her that sometimes you can ovulate on OCPs, although rare, but I don't think she had a hemorrhagic cyst b/c the ovaries are completely normal, normal cbc and no free fluid in the pelvis. Ideally, imaging at the time of pain is ideal but since it's improving I told her I recommend just to keep an eye on it and let us know if it happens again. If AUB continues in the future, could be from chronic OCP use or from the small submucosal fibroid and can consider work up PRN  RTC for labs  I discussed the assessment and treatment plan with the patient. The patient was provided an opportunity to ask questions and all were answered. The patient agreed with the plan and demonstrated an understanding of the instructions.   The patient was advised to call back or seek an in-person evaluation/go to the ED if the symptoms worsen or if the  condition fails to improve as anticipated.  I provided 15 minutes of non-face-to-face time during this encounter. The visit was done via a MyChart visit.   Durene Romans MD Attending [facility] [employer] [facility])

## 2019-01-05 ENCOUNTER — Other Ambulatory Visit: Payer: BC Managed Care – PPO

## 2019-01-09 DIAGNOSIS — C50919 Malignant neoplasm of unspecified site of unspecified female breast: Secondary | ICD-10-CM

## 2019-01-09 HISTORY — DX: Malignant neoplasm of unspecified site of unspecified female breast: C50.919

## 2019-02-27 ENCOUNTER — Telehealth: Payer: Self-pay

## 2019-02-27 NOTE — Telephone Encounter (Signed)
Left a message on patient's voicemail.

## 2019-02-27 NOTE — Telephone Encounter (Signed)
I will talk with her about it at the upcoming visit

## 2019-02-27 NOTE — Telephone Encounter (Signed)
Incoming fax request form [facility].  Hyoscyamine 0.125mg  tablet is not covered with insurance. Please advise. Patient has also scheduled a follow up due to continued IBS flares and abd pains. She states she hasn't had any success with diet changes, she wanted to try and do this route before going to medications.

## 2019-03-02 ENCOUNTER — Ambulatory Visit: Payer: BC Managed Care – PPO | Admitting: Gastroenterology

## 2019-06-26 ENCOUNTER — Telehealth (INDEPENDENT_AMBULATORY_CARE_PROVIDER_SITE_OTHER): Payer: 59 | Admitting: Obstetrics and Gynecology

## 2019-06-26 DIAGNOSIS — L739 Follicular disorder, unspecified: Secondary | ICD-10-CM

## 2019-06-26 DIAGNOSIS — Z1231 Encounter for screening mammogram for malignant neoplasm of breast: Secondary | ICD-10-CM

## 2019-06-26 NOTE — Telephone Encounter (Signed)
Patient called to say she had a painful rash spot under right breast. She would like a call back from a nurse.

## 2019-06-26 NOTE — Telephone Encounter (Signed)
Returned patient's phone call. Patient reports this morning a round raised area showed up on her lower breast. Reports the area is red and tender to the touch. Patient sent picture via mychart. She states she hasn't noticed anything prior to this just that the area looked "wrinkly". Discussed with Dr Elgie Congo who states it appears like folliculitis & advised Ibuprofen & warm compresses to help drain. Discussed with patient. Advised if she experiences worsening pain or redness to call us back. Discussed scheduling mammogram with patient and that someone would call her with an appt. Patient verbalized understanding & stated she would call our office to set up an annual exam as well. Patient had no other questions.

## 2019-06-30 ENCOUNTER — Other Ambulatory Visit: Payer: Self-pay | Admitting: Obstetrics and Gynecology

## 2019-06-30 ENCOUNTER — Telehealth: Payer: Self-pay

## 2019-06-30 DIAGNOSIS — Z1231 Encounter for screening mammogram for malignant neoplasm of breast: Secondary | ICD-10-CM

## 2019-06-30 NOTE — Telephone Encounter (Signed)
Telephoned patient at home number. Patient stated she has [employer]. Therefore she does not qualify for [facility]. Advised the patient to call [facility] and schedule appointment.

## 2019-07-03 ENCOUNTER — Ambulatory Visit: Payer: 59

## 2019-07-27 ENCOUNTER — Ambulatory Visit
Admission: RE | Admit: 2019-07-27 | Discharge: 2019-07-27 | Disposition: A | Discharge status: Home / Self Care | Payer: 59 | Source: Ambulatory Visit | Attending: Obstetrics and Gynecology | Admitting: Obstetrics and Gynecology

## 2019-07-27 ENCOUNTER — Other Ambulatory Visit: Payer: Self-pay

## 2019-07-27 DIAGNOSIS — Z1231 Encounter for screening mammogram for malignant neoplasm of breast: Secondary | ICD-10-CM

## 2019-07-29 ENCOUNTER — Other Ambulatory Visit: Payer: Self-pay | Admitting: Obstetrics and Gynecology

## 2019-07-29 DIAGNOSIS — R928 Other abnormal and inconclusive findings on diagnostic imaging of breast: Secondary | ICD-10-CM

## 2019-08-07 ENCOUNTER — Other Ambulatory Visit: Payer: Self-pay

## 2019-08-07 ENCOUNTER — Ambulatory Visit: Payer: 59

## 2019-08-07 ENCOUNTER — Ambulatory Visit
Admission: RE | Admit: 2019-08-07 | Discharge: 2019-08-07 | Disposition: A | Discharge status: Home / Self Care | Payer: 59 | Source: Ambulatory Visit | Attending: Obstetrics and Gynecology | Admitting: Obstetrics and Gynecology

## 2019-08-07 ENCOUNTER — Other Ambulatory Visit: Payer: Self-pay | Admitting: Obstetrics and Gynecology

## 2019-08-07 DIAGNOSIS — R921 Mammographic calcification found on diagnostic imaging of breast: Secondary | ICD-10-CM

## 2019-08-07 DIAGNOSIS — R928 Other abnormal and inconclusive findings on diagnostic imaging of breast: Secondary | ICD-10-CM

## 2019-08-13 ENCOUNTER — Ambulatory Visit
Admission: RE | Admit: 2019-08-13 | Discharge: 2019-08-13 | Disposition: A | Discharge status: Home / Self Care | Payer: 59 | Source: Ambulatory Visit | Attending: Obstetrics and Gynecology | Admitting: Obstetrics and Gynecology

## 2019-08-13 ENCOUNTER — Other Ambulatory Visit: Payer: Self-pay

## 2019-08-13 DIAGNOSIS — R921 Mammographic calcification found on diagnostic imaging of breast: Secondary | ICD-10-CM

## 2019-08-14 ENCOUNTER — Other Ambulatory Visit: Payer: Self-pay | Admitting: Obstetrics and Gynecology

## 2019-08-14 DIAGNOSIS — C50919 Malignant neoplasm of unspecified site of unspecified female breast: Secondary | ICD-10-CM

## 2019-08-17 ENCOUNTER — Encounter: Payer: Self-pay | Admitting: *Deleted

## 2019-08-17 ENCOUNTER — Ambulatory Visit
Admission: RE | Admit: 2019-08-17 | Discharge: 2019-08-17 | Disposition: A | Discharge status: Home / Self Care | Payer: 59 | Source: Ambulatory Visit | Attending: Obstetrics and Gynecology | Admitting: Obstetrics and Gynecology

## 2019-08-17 ENCOUNTER — Other Ambulatory Visit: Payer: Self-pay

## 2019-08-17 DIAGNOSIS — C50919 Malignant neoplasm of unspecified site of unspecified female breast: Secondary | ICD-10-CM

## 2019-08-17 DIAGNOSIS — D0512 Intraductal carcinoma in situ of left breast: Secondary | ICD-10-CM

## 2019-08-17 NOTE — Progress Notes (Signed)
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 Ductal carcinoma in situ (DCIS) of left breast  D05.12     HISTORY OF PRESENT ILLNESS::Bethany Weaver is a 39 y.o. female who is presenting to the office today for evaluation of her newly diagnosed breast cancer. She is accompanied by her husband. She is doing well overall.   She had routine screening mammography on 07/27/2019 that showed two adjacent possible masses in the right breast and possible micro-calcifications in the left breast. She underwent bilateral diagnostic mammography with tomography and right breast ultrasonography at [facility] on 08/07/2019 that showed a 4.9 cm group of indeterminate calcifications in the lower inner quadrant of the left breast that were suspicious for possible DCIS. There were also two benign cystic areas at the 12 o'clock position of the right breast.  Biopsy on 08/13/2019 revealed high-grade ductal carcinoma in situ with calcifications and necrosis. Prognostic indicators were significant for estrogen receptor, 100% positive with moderate staining intensity and progesterone receptor, 50% positive with weak staining intensity.  Menarche: 64-15 years old Age at first live birth: 39 years old GP: 2 LMP: July 28th - 31st, 2021 Contraceptive: Yes, since she was a teenager HRT: No   The patient was referred today for presentation in the multidisciplinary conference.  Radiology studies and pathology slides were presented there for review and discussion of  treatment options.  A consensus was discussed regarding potential next steps.  PREVIOUS RADIATION THERAPY: No  PAST MEDICAL HISTORY:  Past Medical History:  Diagnosis Date  . Cyst, ovarian   . SVT (supraventricular tachycardia) (HCC)     PAST SURGICAL HISTORY:No past surgical history on file.  FAMILY HISTORY:  Family History  Problem Relation Age of Onset  . Hypotension Mother   . Heart disease Father   . Diabetes Father   . Prostate cancer Father        dx 40s  . Leukemia Maternal Grandmother   . Prostate cancer Maternal Grandfather        metastatic; dx 53s  . Prostate cancer Maternal Uncle        metastatic; dx 10s  . Prostate cancer Paternal Uncle        metastatic; dx 14s  . Cancer Paternal Grandmother        unknown type; dx 31s    SOCIAL HISTORY:  Social History   Socioeconomic History  . Marital status: Married    Spouse name: Not on file  . Number of children: Not on file  . Years of education: Not on file  . Highest education level: Not on file  Occupational History  . Not on file  Tobacco Use  . Smoking status: Never Smoker  . Smokeless tobacco: Never Used  Substance and Sexual Activity  . Alcohol use: Yes  . Drug use: Never  . Sexual activity: Yes    Birth control/protection: Pill  Other Topics Concern  . Not on file  Social History Narrative  . Not on file   Social Determinants of Health   Financial Resource  Strain: Low Risk   . Difficulty of Paying Living Expenses: Not hard at all  Food Insecurity: No Food Insecurity  . Worried About Charity fundraiser in the Last Year: Never true  . Ran Out of Food in the Last Year: Never true  Transportation Needs: No Transportation Needs  . Lack of Transportation (Medical): No  . Lack of Transportation (Non-Medical): No  Physical Activity:   . Days of Exercise per Week:   . Minutes of Exercise per Session:   Stress:   . Feeling of Stress :   Social Connections:   . Frequency of Communication  with Friends and Family:   . Frequency of Social Gatherings with Friends and Family:   . Attends Religious Services:   . Active Member of Clubs or Organizations:   . Attends Archivist Meetings:   Marland Kitchen Marital Status:     ALLERGIES:  Allergies  Allergen Reactions  . Latex Hives and Itching    MEDICATIONS:  Current Outpatient Medications  Medication Sig Dispense Refill  . Cholecalciferol (VITAMIN D) 50 MCG (2000 UT) CAPS Take 1 capsule by mouth daily.    . hyoscyamine (LEVSIN) 0.125 MG tablet Take 1 tablet (0.125 mg total) by mouth every 8 (eight) hours as needed (abdominal pain). 20 tablet 0  . norgestimate-ethinyl estradiol (ORTHO-CYCLEN) 0.25-35 MG-MCG tablet Take 1 tablet by mouth daily. 1 Package 11  . Turmeric (QC TUMERIC COMPLEX) 500 MG CAPS Take 1 capsule by mouth daily.    . vitamin E (VITAMIN E) 180 MG (400 UNITS) capsule Take 400 Units by mouth daily.     No current facility-administered medications for this encounter.    REVIEW OF SYSTEMS: A 10+ POINT REVIEW OF SYSTEMS WAS OBTAINED including neurology, dermatology, psychiatry, cardiac, respiratory, lymph, extremities, GI, GU, musculoskeletal, constitutional, reproductive, HEENT. On the provided form, she reports mild difficulty swallowing, irregular heartbeat, palpitations, abdominal hernia, joint pain in bilateral knees, muscle aches, and wearing contacts. She denies shortness of breath, cough, dysuria, skin changes, and any other symptoms.    PHYSICAL EXAM:   Vitals with BMI 08/19/2019  Height 5\' 3"   Weight 182 lbs 13 oz  BMI 02.77  Systolic 412  Diastolic 61  Pulse 71   Lungs are clear to auscultation bilaterally. Heart has regular rate and rhythm. No palpable cervical, supraclavicular, or axillary adenopathy. Abdomen soft, non-tender, normal bowel sounds. Given that the patient will most likely not be receiving radiation therapy, she deferred the breast examination.   KPS = 100  100 - Normal; no  complaints; no evidence of disease. 90   - Able to carry on normal activity; minor signs or symptoms of disease. 80   - Normal activity with effort; some signs or symptoms of disease. 14   - Cares for self; unable to carry on normal activity or to do active work. 60   - Requires occasional assistance, but is able to care for most of his personal needs. 50   - Requires considerable assistance and frequent medical care. 53   - Disabled; requires special care and assistance. 98   - Severely disabled; hospital admission is indicated although death not imminent. 34   - Very sick; hospital admission necessary; active supportive treatment necessary. 10   - Moribund; fatal processes progressing rapidly. 0     - Dead  Karnofsky DA, Abelmann WH, Craver LS and Burchenal [facility] [phone]) The use of the nitrogen mustards in the palliative treatment of carcinoma: with particular reference to  bronchogenic carcinoma Cancer 1 [id number]  LABORATORY DATA:  Lab Results  Component Value Date   WBC 6.5 08/19/2019   HGB 12.0 08/19/2019   HCT 37.0 08/19/2019   MCV 87.7 08/19/2019   PLT 310 08/19/2019   Lab Results  Component Value Date   NA 140 08/19/2019   K 4.2 08/19/2019   CL 107 08/19/2019   CO2 27 08/19/2019   Lab Results  Component Value Date   ALT 12 08/19/2019   AST 11 (L) 08/19/2019   ALKPHOS 39 08/19/2019   BILITOT 0.3 08/19/2019    PULMONARY FUNCTION TEST:   Recent Review Flowsheet Data   There is no flowsheet data to display.     RADIOGRAPHY: US BREAST LTD UNI RIGHT INC AXILLA  Result Date: 08/07/2019 CLINICAL DATA:  Two possible masses in the 12 o'clock position of the right breast on a recent baseline screening mammogram. Calcifications in the posteromedial aspect of the left breast in the craniocaudal projection of the recent baseline screening mammogram. EXAM: DIGITAL DIAGNOSTIC RIGHT MAMMOGRAM WITH CAD AND TOMO ULTRASOUND RIGHT BREAST COMPARISON:  Baseline screening mammogram dated  07/27/2019. ACR Breast Density Category c: The breast tissue is heterogeneously dense, which may obscure small masses. FINDINGS: 3D tomographic and 2D generated spot compression views of the right breast confirm a rounded, circumscribed mass and an adjacent oval, circumscribed mass in the 12 o'clock position of the breast. 2D true lateral and spot magnification views of the left breast demonstrate a large number of tiny, faint calcifications in the lower inner quadrant spanning an area measuring 4.9 x 4.5 x 3.2 cm. These vary in size, shape and density and some are linearly arranged. Mammographic images were processed with CAD. On physical exam, no mass is palpable in the 12 o'clock position of the right breast. Targeted ultrasound is performed, showing a 1.1 cm simple cyst in the 12 o'clock position of the right breast, 3 cm from the nipple and a 1.4 cm cluster of cysts in the 12 o'clock position of the right breast, 4 cm from the nipple. These correspond to the mammographic masses. IMPRESSION: 1. 4.9 cm group of indeterminate calcifications in the lower inner quadrant of the left breast. These are suspicious for the possibility of DCIS. 2. Two benign cystic areas in the 12 o'clock position of the right breast. RECOMMENDATION: Stereotactic guided core needle biopsy of the 4.9 cm group of calcifications in the lower inner quadrant of the left breast. This has been discussed with the patient and scheduled at 10:30 a.m. on 08/13/2019. I have discussed the findings and recommendations with the patient. If applicable, a reminder letter will be sent to the patient regarding the next appointment. BI-RADS CATEGORY  4: Suspicious. Electronically Signed   By: Claudie Revering M.D.   On: 08/07/2019 12:26   MM DIAG BREAST TOMO BILATERAL  Result Date: 08/07/2019 CLINICAL DATA:  Two possible masses in the 12 o'clock position of the right breast on a recent baseline screening mammogram. Calcifications in the posteromedial aspect  of the left breast in the craniocaudal projection of the recent baseline screening mammogram. EXAM: DIGITAL DIAGNOSTIC RIGHT MAMMOGRAM WITH CAD AND TOMO ULTRASOUND RIGHT BREAST COMPARISON:  Baseline screening mammogram dated 07/27/2019. ACR Breast Density Category c: The breast tissue is heterogeneously dense, which may obscure small masses. FINDINGS: 3D tomographic and 2D generated spot compression views of the right breast confirm a rounded, circumscribed mass and an adjacent oval, circumscribed mass in the 12 o'clock position of the breast. 2D  true lateral and spot magnification views of the left breast demonstrate a large number of tiny, faint calcifications in the lower inner quadrant spanning an area measuring 4.9 x 4.5 x 3.2 cm. These vary in size, shape and density and some are linearly arranged. Mammographic images were processed with CAD. On physical exam, no mass is palpable in the 12 o'clock position of the right breast. Targeted ultrasound is performed, showing a 1.1 cm simple cyst in the 12 o'clock position of the right breast, 3 cm from the nipple and a 1.4 cm cluster of cysts in the 12 o'clock position of the right breast, 4 cm from the nipple. These correspond to the mammographic masses. IMPRESSION: 1. 4.9 cm group of indeterminate calcifications in the lower inner quadrant of the left breast. These are suspicious for the possibility of DCIS. 2. Two benign cystic areas in the 12 o'clock position of the right breast. RECOMMENDATION: Stereotactic guided core needle biopsy of the 4.9 cm group of calcifications in the lower inner quadrant of the left breast. This has been discussed with the patient and scheduled at 10:30 a.m. on 08/13/2019. I have discussed the findings and recommendations with the patient. If applicable, a reminder letter will be sent to the patient regarding the next appointment. BI-RADS CATEGORY  4: Suspicious. Electronically Signed   By: Claudie Revering M.D.   On: 08/07/2019 12:26    MM 3D SCREEN BREAST BILATERAL  Result Date: 07/28/2019 CLINICAL DATA:  Screening. EXAM: DIGITAL SCREENING BILATERAL MAMMOGRAM WITH TOMO AND CAD COMPARISON:  None. ACR Breast Density Category c: The breast tissue is heterogeneously dense, which may obscure small masses. FINDINGS: In the right breast 2 adjacent possible masses requires further evaluation. In the left breast microcalcifications requires further evaluation. Images were processed with CAD. IMPRESSION: Further evaluation is suggested for 2 adjacent possible masses in the right breast. Further evaluation is suggested for possible microcalcifications in the left breast. RECOMMENDATION: Diagnostic mammogram and possibly ultrasound of both breasts. (Code:[id number]) The patient will be contacted regarding the findings, and additional imaging will be scheduled. BI-RADS CATEGORY  0: Incomplete. Need additional imaging evaluation and/or prior mammograms for comparison. Electronically Signed   By: Marin Olp M.D.   On: 07/28/2019 17:03   MM CLIP PLACEMENT LEFT  Result Date: 08/17/2019 CLINICAL DATA:  Patient for biopsy of left breast calcifications. Patient with recent diagnosis of high-grade DCIS. Today's biopsy is for extent of disease. EXAM: DIAGNOSTIC LEFT MAMMOGRAM POST STEREOTACTIC BIOPSY COMPARISON:  Previous exam(s). FINDINGS: Mammographic images were obtained following stereotactic guided biopsy of left breast calcifications. The X shaped marking clip is located at the site of biopsy within the lower inner left breast far posterior depth. Note the coil shaped marking clip is redemonstrated from prior stereotactic guided core needle biopsy. IMPRESSION: X shaped marking clip located within the appropriate position in the lower inner left breast. Final Assessment: Post Procedure Mammograms for Marker Placement Electronically Signed   By: Lovey Newcomer M.D.   On: 08/17/2019 13:47   MM CLIP PLACEMENT LEFT  Result Date: 08/13/2019 CLINICAL DATA:   Patient presents today for stereotactic biopsy of LEFT breast calcifications. EXAM: DIAGNOSTIC LEFT MAMMOGRAM POST STEREOTACTIC BIOPSY COMPARISON:  Previous exam(s). FINDINGS: Mammographic images were obtained following stereotactic guided biopsy of indeterminate calcifications within the lower inner quadrant of the LEFT breast. The biopsy marking clip is in expected position at the site of biopsy. IMPRESSION: Appropriate positioning of the coil shaped biopsy marking clip at the site of biopsy in  the lower inner quadrant of the LEFT breast. Final Assessment: Post Procedure Mammograms for Marker Placement Electronically Signed   By: Franki Cabot M.D.   On: 08/13/2019 11:36   MM LT BREAST BX W LOC DEV 1ST LESION IMAGE BX SPEC STEREO GUIDE  Addendum Date: 08/18/2019   ADDENDUM REPORT: 08/18/2019 13:26 ADDENDUM: Pathology revealed HIGH-GRADE DUCTAL CARCINOMA IN SITU WITH NECROSIS of the Left breast, lower inner. This was found to be concordant by Dr. Lovey Newcomer. Pathology results were discussed with the patient by telephone. The patient reported doing well after the biopsy with tenderness at the site. Post biopsy instructions and care were reviewed and questions were answered. The patient was encouraged to call [facility] for any additional concerns. My direct phone number was provided. The patient was referred to [facility] on August 19, 2019. RECOMMEND BILATERAL BREAST MRI for further evaluation of extent of disease given the High Grade histology as well as fairly extensive nature of calcifications involving the medial aspect of the left breast. Pathology results reported by Terie Purser, RN on 08/18/2019. Electronically Signed   By: Lovey Newcomer M.D.   On: 08/18/2019 13:26   Result Date: 08/18/2019 CLINICAL DATA:  Patient for second biopsy of left breast calcifications to determine extent of disease. A  preprocedural magnification view was obtained and the majority of the calcifications are located posterior to the previous biopsy site. EXAM: LEFT BREAST STEREOTACTIC CORE NEEDLE BIOPSY COMPARISON:  Previous exams. FINDINGS: The patient and I discussed the procedure of stereotactic-guided biopsy including benefits and alternatives. We discussed the high likelihood of a successful procedure. We discussed the risks of the procedure including infection, bleeding, tissue injury, clip migration, and inadequate sampling. Informed written consent was given. The usual time out protocol was performed immediately prior to the procedure. Using sterile technique and 1% Lidocaine as local anesthetic, under stereotactic guidance, a 9 gauge vacuum assisted device was used to perform core needle biopsy of calcifications within the far posteroinferior left breast using a cranial approach. Specimen radiograph was performed showing calcifications. Specimens with calcifications are identified for pathology. Lesion quadrant: Lower inner quadrant At the conclusion of the procedure, X shaped tissue marker clip was deployed into the biopsy cavity. Follow-up 2-view mammogram was performed and dictated separately. IMPRESSION: Stereotactic-guided biopsy of left breast calcifications. No apparent complications. Electronically Signed: By: Lovey Newcomer M.D. On: 08/17/2019 13:41   MM LT BREAST BX W LOC DEV 1ST LESION IMAGE BX SPEC STEREO GUIDE  Addendum Date: 08/18/2019   ADDENDUM REPORT: 08/14/2019 12:09 ADDENDUM: Pathology revealed HIGH GRADE DUCTAL CARCINOMA IN SITU WITH CALCIFICATIONS AND NECROSIS of the Left breast, lower inner quadrant. This was found to be concordant by Dr. Franki Cabot. Pathology results were discussed with the patient by telephone. The patient reported doing well after the biopsy with tenderness at the site. Post biopsy instructions and care were reviewed and questions were answered. The patient was encouraged to call  [facility] for any additional concerns. My direct phone number was provided. The patient was referred to [facility] on August 19, 2019. Consideration for a bilateral breast MRI for further evaluation of extent of disease given the High Grade histology. The patient is scheduled for Left breast stereotatic guided biopsy x 2 on August 17, 2019. Pathology results reported by Terie Purser, RN on 08/14/2019.  Electronically Signed   By: Franki Cabot M.D.   On: 08/14/2019 12:09   Result Date: 08/18/2019 CLINICAL DATA:  Patient with indeterminate calcifications in the LEFT breast presents today for stereotactic biopsy using 3D tomosynthesis guidance. EXAM: LEFT BREAST STEREOTACTIC CORE NEEDLE BIOPSY COMPARISON:  Previous exams. FINDINGS: The patient and I discussed the procedure of stereotactic-guided biopsy including benefits and alternatives. We discussed the high likelihood of a successful procedure. We discussed the risks of the procedure including infection, bleeding, tissue injury, clip migration, and inadequate sampling. Informed written consent was given. The usual time out protocol was performed immediately prior to the procedure. Using sterile technique and 1% Lidocaine as local anesthetic, under stereotactic guidance, a 9 gauge vacuum assisted device was used to perform core needle biopsy of calcifications in the lower inner quadrant of the left breast using a superior approach. Specimen radiograph was performed showing calcifications. Specimens with calcifications are identified for pathology. Lesion quadrant: Lower inner quadrant At the conclusion of the procedure, coil shaped tissue marker clip was deployed into the biopsy cavity. Follow-up 2-view mammogram was performed and dictated separately. IMPRESSION: Stereotactic-guided biopsy of indeterminate calcifications within the LEFT breast. No apparent  complications. Electronically Signed: By: Franki Cabot M.D. On: 08/13/2019 11:27      IMPRESSION: Stage 0, Left Breast LIQ, DCIS, ER+ / PR+, Grade 3  Based on the extent of the calcifications, she would not be a candidate for breast conserving surgery. Thus, a mastectomy is recommended. She will get an MRI for further evaluation but I doubt that she would still be a candidate for breast conserving surgery.   We discussed the general indications for post-mastectomy radiation therapy. With this being a large and invasive tumor with positive margins and/or positive lymph nodes, I doubt that she would require post-mastectomy radiation therapy at this point.  PLAN:  1. MRI 2. Left mastectomy with sentinel lymph node biopsy 3. Possible reconstructive surgery 4. Anti-estrogen therapy   ------------------------------------------------  Blair Promise, PhD, MD  This document serves as a record of services personally performed by Gery Pray, MD. It was created on his behalf by Clerance Lav, a trained medical scribe. The creation of this record is based on the scribe's personal observations and the provider's statements to them. This document has been checked and approved by the attending provider.

## 2019-08-18 NOTE — Progress Notes (Signed)
[place] NOTE  Patient Care Team: Patient, No Pcp Per as PCP - General (General Practice) Donnie Mesa, MD as Consulting Physician (General Surgery) Nicholas Lose, MD as Consulting Physician (Hematology and Oncology) Gery Pray, MD as Consulting Physician (Radiation Oncology) Mauro Kaufmann, RN as Oncology Nurse Navigator Rockwell Germany, RN as Oncology Nurse Navigator  CHIEF COMPLAINTS/PURPOSE OF CONSULTATION:  Newly diagnosed breast cancer  HISTORY OF PRESENTING ILLNESS:  Bethany Weaver 39 y.o. female is here because of recent diagnosis of left breast ductal carcinoma in situ. Screening mammogram on 07/28/19 showed 2 masses in the right breast and microcalcifications in the left breast. Diagnostic mammogram on 08/07/19 showed calcifications in the left breast spanning 4.9cm, and two adjacent cysts in the right breast. Left breast biopsy on 08/13/19 showed ductal carcinoma in situ, high grade, ER+ 100%, PR+ 50%. She presents to the clinic today for initial evaluation and discussion of treatment options.   I reviewed her records extensively and collaborated the history with the patient.  SUMMARY OF ONCOLOGIC HISTORY: Oncology History  Ductal carcinoma in situ (DCIS) of left breast  08/17/2019 Initial Diagnosis   Screening mammogram showed 2 right breast masses and microcalcifications in the left breast. Diagnostic mammogram showed calcifications in the left breast spanning 4.9cm, and two adjacent cysts in the right breast. Left breast biopsy showed DCIS, high grade, ER+ 100%, PR+ 50%.     MEDICAL HISTORY:  Past Medical History:  Diagnosis Date  . Cyst, ovarian   . SVT (supraventricular tachycardia) (HCC)     SURGICAL HISTORY: No past surgical history on file.  SOCIAL HISTORY: Social History   Socioeconomic History  . Marital status: Married    Spouse name: Not on file  . Number of children: Not on file  . Years of education: Not on  file  . Highest education level: Not on file  Occupational History  . Not on file  Tobacco Use  . Smoking status: Never Smoker  . Smokeless tobacco: Never Used  Substance and Sexual Activity  . Alcohol use: Yes  . Drug use: Never  . Sexual activity: Yes    Birth control/protection: Pill  Other Topics Concern  . Not on file  Social History Narrative  . Not on file   Social Determinants of Health   Financial Resource Strain: Low Risk   . Difficulty of Paying Living Expenses: Not hard at all  Food Insecurity: No Food Insecurity  . Worried About Charity fundraiser in the Last Year: Never true  . Ran Out of Food in the Last Year: Never true  Transportation Needs: No Transportation Needs  . Lack of Transportation (Medical): No  . Lack of Transportation (Non-Medical): No  Physical Activity:   . Days of Exercise per Week:   . Minutes of Exercise per Session:   Stress:   . Feeling of Stress :   Social Connections:   . Frequency of Communication with Friends and Family:   . Frequency of Social Gatherings with Friends and Family:   . Attends Religious Services:   . Active Member of Clubs or Organizations:   . Attends Archivist Meetings:   Marland Kitchen Marital Status:   Intimate Partner Violence:   . Fear of Current or Ex-Partner:   . Emotionally Abused:   Marland Kitchen Physically Abused:   . Sexually Abused:     FAMILY HISTORY: Family History  Problem Relation Age of Onset  . Hypotension Mother   . Heart  disease Father   . Diabetes Father   . Prostate cancer Father   . Leukemia Maternal Grandmother   . Prostate cancer Maternal Grandfather   . Prostate cancer Maternal Uncle   . Prostate cancer Paternal Uncle     ALLERGIES:  is allergic to latex.  MEDICATIONS:  Current Outpatient Medications  Medication Sig Dispense Refill  . Cholecalciferol (VITAMIN D) 50 MCG (2000 UT) CAPS Take 1 capsule by mouth daily.    . Turmeric (QC TUMERIC COMPLEX) 500 MG CAPS Take 1 capsule by mouth  daily.    . vitamin E (VITAMIN E) 180 MG (400 UNITS) capsule Take 400 Units by mouth daily.    . hyoscyamine (LEVSIN) 0.125 MG tablet Take 1 tablet (0.125 mg total) by mouth every 8 (eight) hours as needed (abdominal pain). 20 tablet 0  . norgestimate-ethinyl estradiol (ORTHO-CYCLEN) 0.25-35 MG-MCG tablet Take 1 tablet by mouth daily. 1 Package 11   No current facility-administered medications for this visit.    REVIEW OF SYSTEMS:   As above all other systems are negative  PHYSICAL EXAMINATION: ECOG PERFORMANCE STATUS: 1 - Symptomatic but completely ambulatory  Vitals:   08/19/19 0854  BP: 118/61  Pulse: 71  Resp: 16  Temp: 98.6 F (37 C)  SpO2: 100%   Filed Weights   08/19/19 0854  Weight: 182 lb 12.8 oz (82.9 kg)      LABORATORY DATA:  I have reviewed the data as listed Lab Results  Component Value Date   WBC 6.5 08/19/2019   HGB 12.0 08/19/2019   HCT 37.0 08/19/2019   MCV 87.7 08/19/2019   PLT 310 08/19/2019   Lab Results  Component Value Date   NA 140 08/19/2019   K 4.2 08/19/2019   CL 107 08/19/2019   CO2 27 08/19/2019    RADIOGRAPHIC STUDIES: I have personally reviewed the radiological reports and agreed with the findings in the report.  ASSESSMENT AND PLAN:  Ductal carcinoma in situ (DCIS) of left breast 08/17/2019:Screening mammogram showed 2 right breast masses and microcalcifications in the left breast. Diagnostic mammogram showed calcifications in the left breast spanning 4.9cm, and two adjacent cysts in the right breast. Left breast biopsy showed DCIS, high grade, ER+ 100%, PR+ 50%.  Pathology review: I discussed with the patient the difference between DCIS and invasive breast cancer. It is considered a precancerous lesion. DCIS is classified as a 0. It is generally detected through mammograms as calcifications. We discussed the significance of grades and its impact on prognosis. We also discussed the importance of ER and PR receptors and their  implications to adjuvant treatment options. Prognosis of DCIS dependence on grade, comedo necrosis. It is anticipated that if not treated, 20-30% of DCIS can develop into invasive breast cancer.  Recommendation: 1.  Mastectomy given the extent of disease with reconstruction 2. Followed by antiestrogen therapy with tamoxifen 5 years  Tamoxifen counseling: We discussed the risks and benefits of tamoxifen. These include but not limited to insomnia, hot flashes, mood changes, vaginal dryness, and weight gain. Although rare, serious side effects including endometrial cancer, risk of blood clots were also discussed. We strongly believe that the benefits far outweigh the risks. Patient understands these risks and consented to starting treatment. Planned treatment duration is 5 years.  Patient was very concerned about tamoxifen adverse effects.  We discussed TAM-01 clinical trial which showed that tamoxifen was effective even at a low dose of 5 mg.  She would be more willing to accept a lower  dosage of tamoxifen.  We will discuss more about this after she undergoes surgery.  Regarding reconstruction patient may want to be referred to [facility].  Return to clinic after surgery to discuss the final pathology report and come up with an adjuvant treatment plan.  All questions were answered. The patient knows to call the clinic with any problems, questions or concerns.   Rulon Eisenmenger, MD, MPH 08/19/2019    I, Molly Dorshimer, am acting as scribe for Nicholas Lose, MD.  I have reviewed the above documentation for accuracy and completeness, and I agree with the above.

## 2019-08-19 ENCOUNTER — Inpatient Hospital Stay: Payer: 59 | Attending: Hematology and Oncology | Admitting: Hematology and Oncology

## 2019-08-19 ENCOUNTER — Encounter: Payer: Self-pay | Admitting: Genetic Counselor

## 2019-08-19 ENCOUNTER — Ambulatory Visit (HOSPITAL_BASED_OUTPATIENT_CLINIC_OR_DEPARTMENT_OTHER): Payer: 59 | Admitting: Genetic Counselor

## 2019-08-19 ENCOUNTER — Other Ambulatory Visit: Payer: Self-pay | Admitting: *Deleted

## 2019-08-19 ENCOUNTER — Inpatient Hospital Stay: Payer: 59

## 2019-08-19 ENCOUNTER — Ambulatory Visit: Payer: Self-pay | Admitting: Surgery

## 2019-08-19 ENCOUNTER — Encounter: Payer: Self-pay | Admitting: *Deleted

## 2019-08-19 ENCOUNTER — Other Ambulatory Visit: Payer: Self-pay

## 2019-08-19 ENCOUNTER — Encounter: Payer: Self-pay | Admitting: Licensed Clinical Social Worker

## 2019-08-19 ENCOUNTER — Encounter: Payer: Self-pay | Admitting: Physical Therapy

## 2019-08-19 ENCOUNTER — Ambulatory Visit
Admission: RE | Admit: 2019-08-19 | Discharge: 2019-08-19 | Disposition: A | Discharge status: Home / Self Care | Payer: 59 | Source: Ambulatory Visit | Attending: Radiation Oncology | Admitting: Radiation Oncology

## 2019-08-19 ENCOUNTER — Ambulatory Visit: Payer: 59 | Admitting: Physical Therapy

## 2019-08-19 DIAGNOSIS — D0512 Intraductal carcinoma in situ of left breast: Secondary | ICD-10-CM | POA: Insufficient documentation

## 2019-08-19 DIAGNOSIS — Z8042 Family history of malignant neoplasm of prostate: Secondary | ICD-10-CM | POA: Insufficient documentation

## 2019-08-19 DIAGNOSIS — N6310 Unspecified lump in the right breast, unspecified quadrant: Secondary | ICD-10-CM | POA: Insufficient documentation

## 2019-08-19 DIAGNOSIS — R920 Mammographic microcalcification found on diagnostic imaging of breast: Secondary | ICD-10-CM | POA: Diagnosis not present

## 2019-08-19 DIAGNOSIS — Z806 Family history of leukemia: Secondary | ICD-10-CM | POA: Diagnosis not present

## 2019-08-19 DIAGNOSIS — Z833 Family history of diabetes mellitus: Secondary | ICD-10-CM | POA: Insufficient documentation

## 2019-08-19 DIAGNOSIS — R293 Abnormal posture: Secondary | ICD-10-CM | POA: Insufficient documentation

## 2019-08-19 DIAGNOSIS — Z170 Estrogen receptor positive status [ER+]: Secondary | ICD-10-CM | POA: Insufficient documentation

## 2019-08-19 DIAGNOSIS — Z8249 Family history of ischemic heart disease and other diseases of the circulatory system: Secondary | ICD-10-CM | POA: Diagnosis not present

## 2019-08-19 DIAGNOSIS — Z79899 Other long term (current) drug therapy: Secondary | ICD-10-CM | POA: Diagnosis not present

## 2019-08-19 LAB — CBC WITH DIFFERENTIAL (CANCER CENTER ONLY)
Abs Immature Granulocytes: 0 10*3/uL (ref 0.00–0.07)
Basophils Absolute: 0 10*3/uL (ref 0.0–0.1)
Basophils Relative: 1 %
Eosinophils Absolute: 0.1 10*3/uL (ref 0.0–0.5)
Eosinophils Relative: 1 %
HCT: 37 % (ref 36.0–46.0)
Hemoglobin: 12 g/dL (ref 12.0–15.0)
Immature Granulocytes: 0 %
Lymphocytes Relative: 29 %
Lymphs Abs: 1.9 10*3/uL (ref 0.7–4.0)
MCH: 28.4 pg (ref 26.0–34.0)
MCHC: 32.4 g/dL (ref 30.0–36.0)
MCV: 87.7 fL (ref 80.0–100.0)
Monocytes Absolute: 0.5 10*3/uL (ref 0.1–1.0)
Monocytes Relative: 7 %
Neutro Abs: 4 10*3/uL (ref 1.7–7.7)
Neutrophils Relative %: 62 %
Platelet Count: 310 10*3/uL (ref 150–400)
RBC: 4.22 MIL/uL (ref 3.87–5.11)
RDW: 13.2 % (ref 11.5–15.5)
WBC Count: 6.5 10*3/uL (ref 4.0–10.5)
nRBC: 0 % (ref 0.0–0.2)

## 2019-08-19 LAB — GENETIC SCREENING ORDER

## 2019-08-19 LAB — CMP (CANCER CENTER ONLY)
ALT: 12 U/L (ref 0–44)
AST: 11 U/L — ABNORMAL LOW (ref 15–41)
Albumin: 3.7 g/dL (ref 3.5–5.0)
Alkaline Phosphatase: 39 U/L (ref 38–126)
Anion gap: 6 (ref 5–15)
BUN: 7 mg/dL (ref 6–20)
CO2: 27 mmol/L (ref 22–32)
Calcium: 8.9 mg/dL (ref 8.9–10.3)
Chloride: 107 mmol/L (ref 98–111)
Creatinine: 0.8 mg/dL (ref 0.44–1.00)
GFR, Est AFR Am: >60 mL/min (ref >60)
GFR, Estimated: >60 mL/min (ref >60)
Glucose, Bld: 96 mg/dL (ref 70–99)
Potassium: 4.2 mmol/L (ref 3.5–5.1)
Sodium: 140 mmol/L (ref 135–145)
Total Bilirubin: 0.3 mg/dL (ref 0.3–1.2)
Total Protein: 7.1 g/dL (ref 6.5–8.1)

## 2019-08-19 NOTE — Patient Instructions (Signed)

## 2019-08-19 NOTE — H&P (Signed)
History of Present Illness Bethany Weaver. Suraj Ramdass MD; 08/19/2019 10:07 AM) The patient is a 39 year old female who presents with breast cancer. [place] 08/19/19 [place]  This is a healthy 39 year old female with no family history of breast cancer who presents after her initial screening mammogram. She was found to have a 4.9 cm area of calcifications in the left lower inner quadrant. She had two areas in this area biopsied and both revealed DCIS. She presents to [facility] for her initial evaluation. No complaints in her breasts prior to these mammograms/ biopsies.  Path - DCIS high grade - ER/PR positive  CLINICAL DATA: Screening.  EXAM: DIGITAL SCREENING BILATERAL MAMMOGRAM WITH TOMO AND CAD  COMPARISON: None.  ACR Breast Density Category c: The breast tissue is heterogeneously dense, which may obscure small masses.  FINDINGS: In the right breast 2 adjacent possible masses requires further evaluation.  In the left breast microcalcifications requires further evaluation.  Images were processed with CAD.  IMPRESSION: Further evaluation is suggested for 2 adjacent possible masses in the right breast.  Further evaluation is suggested for possible microcalcifications in the left breast.  RECOMMENDATION: Diagnostic mammogram and possibly ultrasound of both breasts. (Code:[id number])  The patient will be contacted regarding the findings, and additional imaging will be scheduled.  BI-RADS CATEGORY 0: Incomplete. Need additional imaging evaluation and/or prior mammograms for comparison.   Electronically Signed By: Marin Olp M.D. On: 07/28/2019 17:03  CLINICAL DATA: Two possible masses in the 12 o'clock position of the right breast on a recent baseline screening mammogram. Calcifications in the posteromedial aspect of the left breast in the craniocaudal projection of the recent baseline screening mammogram.  EXAM: DIGITAL DIAGNOSTIC RIGHT MAMMOGRAM  WITH CAD AND TOMO  ULTRASOUND RIGHT BREAST  COMPARISON: Baseline screening mammogram dated 07/27/2019.  ACR Breast Density Category c: The breast tissue is heterogeneously dense, which may obscure small masses.  FINDINGS: 3D tomographic and 2D generated spot compression views of the right breast confirm a rounded, circumscribed mass and an adjacent oval, circumscribed mass in the 12 o'clock position of the breast.  2D true lateral and spot magnification views of the left breast demonstrate a large number of tiny, faint calcifications in the lower inner quadrant spanning an area measuring 4.9 x 4.5 x 3.2 cm. These vary in size, shape and density and some are linearly arranged.  Mammographic images were processed with CAD.  On physical exam, no mass is palpable in the 12 o'clock position of the right breast.  Targeted ultrasound is performed, showing a 1.1 cm simple cyst in the 12 o'clock position of the right breast, 3 cm from the nipple and a 1.4 cm cluster of cysts in the 12 o'clock position of the right breast, 4 cm from the nipple. These correspond to the mammographic masses.  IMPRESSION: 1. 4.9 cm group of indeterminate calcifications in the lower inner quadrant of the left breast. These are suspicious for the possibility of DCIS. 2. Two benign cystic areas in the 12 o'clock position of the right breast.  RECOMMENDATION: Stereotactic guided core needle biopsy of the 4.9 cm group of calcifications in the lower inner quadrant of the left breast. This has been discussed with the patient and scheduled at 10:30 a.m. on 08/13/2019.  I have discussed the findings and recommendations with the patient. If applicable, a reminder letter will be sent to the patient regarding the next appointment.  BI-RADS CATEGORY 4: Suspicious.   Electronically Signed By: Percell Locus.D.  On: 08/07/2019 12:26  CLINICAL DATA: Patient for biopsy of left breast  calcifications. Patient with recent diagnosis of high-grade DCIS. Today's biopsy is for extent of disease.  EXAM: DIAGNOSTIC LEFT MAMMOGRAM POST STEREOTACTIC BIOPSY  COMPARISON: Previous exam(s).  FINDINGS: Mammographic images were obtained following stereotactic guided biopsy of left breast calcifications. The X shaped marking clip is located at the site of biopsy within the lower inner left breast far posterior depth.  Note the coil shaped marking clip is redemonstrated from prior stereotactic guided core needle biopsy.  IMPRESSION: X shaped marking clip located within the appropriate position in the lower inner left breast.  Final Assessment: Post Procedure Mammograms for Marker Placement   Electronically Signed By: Lovey Newcomer M.D. On: 08/17/2019 13:47    Past Surgical History Conni Slipper, RN; 08/19/2019 8:19 AM) Colon Polyp Removal - Colonoscopy Oral Surgery  Diagnostic Studies History Conni Slipper, RN; 08/19/2019 8:19 AM) Colonoscopy 1-5 years ago Mammogram within last year Pap Smear 1-5 years ago  Allergies Rodman Key K. Stephan Draughn, MD; 08/19/2019 10:07 AM) Latex Exam Gloves *MEDICAL DEVICES AND SUPPLIES* Hives, Itching.  Medication History Bethany Weaver. Benedict Kue, MD; 08/19/2019 10:07 AM) Medications Reconciled Levsin (0.125MG  Tablet, Oral) Active. Ortho-Cyclen (21) (0.25-35MG -MCG Tablet, Oral) Active.  Social History Conni Slipper, RN; 08/19/2019 8:19 AM) Alcohol use Occasional alcohol use. No caffeine use No drug use Tobacco use Never smoker.  Family History Conni Slipper, RN; 08/19/2019 8:19 AM) Arthritis Family Members In General, Father, Mother, Sister. Bleeding disorder Father. Cerebrovascular Accident Father. Colon Polyps Father. Diabetes Mellitus Father. Heart Disease Father. Heart disease in female family member before age 40 Hypertension Father, Mother, Sister. Prostate Cancer Father. Respiratory Condition Father. Thyroid  problems Father, Sister.  Pregnancy / Birth History Conni Slipper, RN; 08/19/2019 8:19 AM) Age at menarche 69 years. Contraceptive History Oral contraceptives. Gravida 2 Length (months) of breastfeeding 12-24 Maternal age 51-30 Para 2 Regular periods  Other Problems Conni Slipper, RN; 08/19/2019 8:19 AM) Back Pain Gastroesophageal Reflux Disease Other disease, cancer, significant illness     Review of Systems Conni Slipper RN; 08/19/2019 8:19 AM) General Present- Fatigue. Not Present- Appetite Loss, Chills, Fever, Night Sweats, Weight Gain and Weight Loss. Skin Not Present- Change in Wart/Mole, Dryness, Hives, Jaundice, New Lesions, Non-Healing Wounds, Rash and Ulcer. HEENT Present- Wears glasses/contact lenses. Not Present- Earache, Hearing Loss, Hoarseness, Nose Bleed, Oral Ulcers, Ringing in the Ears, Seasonal Allergies, Sinus Pain, Sore Throat, Visual Disturbances and Yellow Eyes. Respiratory Not Present- Bloody sputum, Chronic Cough, Difficulty Breathing, Snoring and Wheezing. Breast Present- Breast Mass, Breast Pain and Skin Changes. Not Present- Nipple Discharge. Cardiovascular Present- Palpitations and Rapid Heart Rate. Not Present- Chest Pain, Difficulty Breathing Lying Down, Leg Cramps, Shortness of Breath and Swelling of Extremities. Gastrointestinal Present- Constipation, Difficulty Swallowing and Excessive gas. Not Present- Abdominal Pain, Bloating, Bloody Stool, Change in Bowel Habits, Chronic diarrhea, Gets full quickly at meals, Hemorrhoids, Indigestion, Nausea, Rectal Pain and Vomiting. Musculoskeletal Present- Back Pain, Joint Pain, Joint Stiffness, Muscle Pain, Muscle Weakness and Swelling of Extremities. Neurological Not Present- Decreased Memory, Fainting, Headaches, Numbness, Seizures, Tingling, Tremor, Trouble walking and Weakness. Psychiatric Not Present- Anxiety, Bipolar, Change in Sleep Pattern, Depression, Fearful and Frequent crying. Endocrine Not  Present- Cold Intolerance, Excessive Hunger, Hair Changes, Heat Intolerance, Hot flashes and New Diabetes. Hematology Not Present- Blood Thinners, Easy Bruising, Excessive bleeding, Gland problems, HIV and Persistent Infections.   Physical Exam Rodman Key K. Colette Dicamillo MD; 08/19/2019 10:08 AM)  The physical exam findings are as follows: Note:Constitutional: WDWN in  NAD, conversant, no obvious deformities; resting comfortably Eyes: Pupils equal, round; sclera anicteric; moist conjunctiva; no lid lag HENT: Oral mucosa moist; good dentition Neck: No masses palpated, trachea midline; no thyromegaly Breasts: symmetric; no nipple retraction or discharge; healing left biopsy sites. No axillary lymphadenopathy or supraclavicular lymphadenopathy. No palpable masses in either breast Lungs: CTA bilaterally; normal respiratory effort CV: Regular rate and rhythm; no murmurs; extremities well-perfused with no edema Abd: +bowel sounds, soft, non-tender, no palpable organomegaly; no palpable hernias Musc: Normal gait; no apparent clubbing or cyanosis in extremities Lymphatic: No palpable cervical or axillary lymphadenopathy Skin: Warm, dry; no sign of jaundice Psychiatric - alert and oriented x 4; calm mood and affect    Assessment & Plan Rodman Key K. Xitlali Kastens MD; 08/19/2019 10:12 AM)  DUCTAL CARCINOMA IN SITU (DCIS) OF LEFT BREAST (D05.12)  Current Plans Referred to Genetic Counseling, for evaluation and follow up (Medical Genetics). Routine. Referred to Surgery - Plastic, for evaluation and follow up (Plastic Surgery). Routine. Note:We will obtain an MRI to determine extent, proximity to the nipple-areolar complex, and to rule out contralateral disease Genetic testing Plastic surgery referral to discuss options for reconstruction. Based on the location of the calcifications and pending the MRI results, she will likely be a candidate for nipple-sparing mastectomy with immediate reconstruction. At the time  of mastectomy, we will plan sentinel lymph node biopsy. I explained the rationale behind SLNB to the patient and her husband.  Adjuvant anti-estrogen therapy. No role for radiation therapy at this time.   Bethany Weaver. Georgette Dover, MD, [facility] Surgery  General/ Trauma Surgery   08/19/2019 10:14 AM

## 2019-08-19 NOTE — Progress Notes (Addendum)
REFERRING PROVIDER: Nicholas Lose, MD [address] [place],  [place] [address]  PRIMARY PROVIDER:  Perlie Gold., PA   PRIMARY REASON FOR VISIT:  1. Ductal carcinoma in situ (DCIS) of left breast   2. Family history of prostate cancer    HISTORY OF PRESENT ILLNESS:   [name], a 39 y.o. female, was seen during the breast multidisciplinary clinic for a [place] cancer genetics consultation at the request of Dr. Lindi Weaver due to a personal history of ductal carcinoma in situ (DCIS).  [name] presents to clinic today with her husband to discuss the possibility of a hereditary predisposition to cancer, to discuss genetic testing, and to further clarify her future cancer risks, as well as potential cancer risks for family members.   In 2021, at the age of 64, [name] was diagnosed with DCIS of the left breast. The preliminary treatment plan includes mastectomy and anti-estrogens.   CANCER HISTORY:  Oncology History  Ductal carcinoma in situ (DCIS) of left breast  08/17/2019 Initial Diagnosis   Screening mammogram showed 2 right breast masses and microcalcifications in the left breast. Diagnostic mammogram showed calcifications in the left breast spanning 4.9cm, and two adjacent cysts in the right breast. Left breast biopsy showed DCIS, high grade, ER+ 100%, PR+ 50%.     RISK FACTORS:  Menarche was at age 64.  First live birth at age 13.  OCP use for approximately 20 years.  Ovaries intact: yes.  Hysterectomy: no.  Menopausal status: premenopausal.  HRT use: 0 years. Colonoscopy: yes; most recent colonoscopy in 2019. Mammogram within the last year: yes. Up to date with pelvic exams: yes. Any excessive radiation exposure in the past: no  Past Medical History:  Diagnosis Date  . Cyst, ovarian   . SVT (supraventricular tachycardia) ([facility])     No past surgical history on file.  Social History   Socioeconomic History  . Marital status: Married     Spouse name: Not on file  . Number of children: Not on file  . Years of education: Not on file  . Highest education level: Not on file  Occupational History  . Not on file  Tobacco Use  . Smoking status: Never Smoker  . Smokeless tobacco: Never Used  Substance and Sexual Activity  . Alcohol use: Yes  . Drug use: Never  . Sexual activity: Yes    Birth control/protection: Pill  Other Topics Concern  . Not on file  Social History Narrative  . Not on file   Social Determinants of Health   Financial Resource Strain: Low Risk   . Difficulty of Paying Living Expenses: Not hard at all  Food Insecurity: No Food Insecurity  . Worried About Charity fundraiser in the Last Year: Never true  . Ran Out of Food in the Last Year: Never true  Transportation Needs: No Transportation Needs  . Lack of Transportation (Medical): No  . Lack of Transportation (Non-Medical): No  Physical Activity:   . Days of Exercise per Week:   . Minutes of Exercise per Session:   Stress:   . Feeling of Stress :   Social Connections:   . Frequency of Communication with Friends and Family:   . Frequency of Social Gatherings with Friends and Family:   . Attends Religious Services:   . Active Member of Clubs or Organizations:   . Attends Archivist Meetings:   Marland Kitchen Marital Status:      FAMILY HISTORY:  We obtained a detailed, 4-generation family history.  Significant diagnoses are listed below: Family History  Problem Relation Age of Onset  . Hypotension Mother   . Heart disease Father   . Diabetes Father   . Prostate cancer Father        dx 45s  . Leukemia Maternal Grandmother   . Prostate cancer Maternal Grandfather        metastatic; dx 16s  . Prostate cancer Maternal Uncle        metastatic; dx 32s  . Prostate cancer Paternal Uncle        metastatic; dx 59s  . Cancer Paternal Grandmother        unknown type; dx 48s        [name] has two children, ages 47 and 38, both without a  cancer history.  She has three sisters, none of whom have had cancer.  Her mother is living at age 51.  [name] has one maternal uncle who had metastatic prostate cancer diagnosed in his 39s and passed away in his 41s.  [name] maternal grandfather had metastatic prostate cancer diagnosed in his 33s, and her maternal grandmother passed away from leukemia, diagnosed in her 63s.  [name] also has a maternal great aunt and a distant maternal cousin with a breast cancer history, diagnosed in their 12s and 37s, respectively.  [name] father is living at age 36.  He was diagnosed with prostate cancer in his 35s.  [name] paternal half uncle was diagnosed with metastatic prostate cancer and passed away in his 44s.  [name] maternal grandmother had cancer of an unknown type diagnosed in her 35s.  No other cancer history was reported in the family.  [name] is unaware of previous family history of genetic testing for hereditary cancer risks. Patient's maternal ancestors are of African American descent, and paternal ancestors are of African American descent. There is no reported Ashkenazi Jewish ancestry. There is no known consanguinity.  GENETIC COUNSELING ASSESSMENT: [name] is a 39 y.o. female with a personal history of DCIS and family history of metastatic prostate cancer which is somewhat suggestive of a hereditary cancer syndrome and predisposition to cancer given the age at which she was diagnosed and related cancers in multiple generations of her family. We, therefore, discussed and recommended the following at today's visit.   DISCUSSION: We discussed that 5 - 10% of cancer is hereditary, with most cases of hereditary breast cancer associated with mutations in the BRCA1 and BRCA2 genes.  There are other genes that can be associated with hereditary cancer syndromes.  Type of cancer risk and level of risk are gene-specific.  We discussed that testing is beneficial for  several reasons including knowing how to follow individuals after completing their treatment, identifying whether potential treatment options would be beneficial, and understanding if other family members could be at risk for cancer and allowing them to undergo genetic testing.   We reviewed the characteristics, features and inheritance patterns of hereditary cancer syndromes. We also discussed genetic testing, including the appropriate family members to test, the process of testing, insurance coverage and turn-around-time for results. We discussed the implications of a negative, positive and/or variant of uncertain significant result. In order to get genetic test results in a timely manner so that [name] can use these genetic test results for surgical decisions, we recommended [name] pursue genetic testing for the STAT Breast Cancer Panel. The STAT Breast cancer panel offered by  [facility] includes sequencing and rearrangement analysis for the following 9 genes:  ATM, BRCA1, BRCA2, CDH1, CHEK2, PALB2, PTEN, STK11 and TP53.  Once complete, we recommend [name] pursue reflex genetic testing to a more comprehensive gene panel.   [name]  was offered a common hereditary cancer panel (48 genes) and an expanded pan-cancer panel (85 genes). [name] was informed of the benefits and limitations of each panel, including that expanded pan-cancer panels contain several preliminary evidence genes that do not have clear management guidelines at this point in time.  We also discussed that as the number of genes included on a panel increases, the chances of variants of uncertain significance increases.  After considering the benefits and limitations of each gene panel, [name]  elected to have common hereditary cancer panel through [facility].  The Common Hereditary Cancers Panel offered by [facility] includes sequencing and/or deletion duplication testing of the following 48 genes: APC, ATM, AXIN2,  BARD1, BMPR1A, BRCA1, BRCA2, BRIP1, CDH1, CDK4, CDKN2A (p14ARF), CDKN2A (p16INK4a), CHEK2, CTNNA1, DICER1, EPCAM (Deletion/duplication testing only), GREM1 (promoter region deletion/duplication testing only), KIT, MEN1, MLH1, MSH2, MSH3, MSH6, MUTYH, NBN, NF1, NHTL1, PALB2, PDGFRA, PMS2, POLD1, POLE, PTEN, RAD50, RAD51C, RAD51D, RNF43, SDHB, SDHC, SDHD, SMAD4, SMARCA4. STK11, TP53, TSC1, TSC2, and VHL.  The following genes were evaluated for sequence changes only: SDHA and HOXB13 c.251G>A variant only.  Based on Ms. Williams's personal and family history of cancer, she meets medical criteria for genetic testing. Despite that she meets criteria, she may still have an out of pocket cost. We discussed that if her out of pocket cost for testing is over $100, the laboratory may call and confirm whether she wants to proceed with testing.  If the out of pocket cost of testing is less than $100 she will be billed by the genetic testing laboratory.   PLAN: After considering the risks, benefits, and limitations, Ms. Williams provided informed consent to pursue genetic testing and the blood sample was sent to [facility] for analysis of the STAT Breast Cancer Panel and Common Hereditary Cancers Panel. Results should be available within approximately 10 days for the STAT Breast Cancer Panel and approximately 3 weeks for the Common Hereditary Cancers Panel, at which point they will be disclosed by telephone to [name], as will any additional recommendations warranted by these results. [name] will receive a summary of her genetic counseling visit and a copy of her results once available. This information will also be available in Epic.   Lastly, we encouraged Ms. Williams to remain in contact with cancer genetics annually so that we can continuously update the family history and inform her of any changes in cancer genetics and testing that may be of benefit for this family.   Ms. Bebe Liter questions  were answered to her satisfaction today. Our contact information was provided should additional questions or concerns arise. Thank you for the referral and allowing [place] to share in the care of your patient.   Densil Ottey M. Joette Catching, [email or web address] (P) [phone]  The patient was seen for a total of 25 minutes in face-to-face genetic counseling.  This patient was discussed with Drs. Magrinat, Bethany Weaver and/or Burr Medico who agrees with the above.   _______________________________________________________________________ For Office Staff:  Number of people involved in session: 1 Was an Intern/ student involved with case: no

## 2019-08-19 NOTE — Assessment & Plan Note (Signed)
08/17/2019:Screening mammogram showed 2 right breast masses and microcalcifications in the left breast. Diagnostic mammogram showed calcifications in the left breast spanning 4.9cm, and two adjacent cysts in the right breast. Left breast biopsy showed DCIS, high grade, ER+ 100%, PR+ 50%.  Pathology review: I discussed with the patient the difference between DCIS and invasive breast cancer. It is considered a precancerous lesion. DCIS is classified as a 0. It is generally detected through mammograms as calcifications. We discussed the significance of grades and its impact on prognosis. We also discussed the importance of ER and PR receptors and their implications to adjuvant treatment options. Prognosis of DCIS dependence on grade, comedo necrosis. It is anticipated that if not treated, 20-30% of DCIS can develop into invasive breast cancer.  Recommendation: 1.  Mastectomy given the extent of disease with reconstruction 2. Followed by antiestrogen therapy with tamoxifen 5 years  Tamoxifen counseling: We discussed the risks and benefits of tamoxifen. These include but not limited to insomnia, hot flashes, mood changes, vaginal dryness, and weight gain. Although rare, serious side effects including endometrial cancer, risk of blood clots were also discussed. We strongly believe that the benefits far outweigh the risks. Patient understands these risks and consented to starting treatment. Planned treatment duration is 5 years.  Return to clinic after surgery to discuss the final pathology report and come up with an adjuvant treatment plan.

## 2019-08-19 NOTE — Progress Notes (Signed)
[place] Clinical Social Work INITIAL SDOH Screening Note   Bethany Weaver "Bethany Weaver" is a 38 y.o. year old female accompanied by her husband, Aida Puffer.   Bethany Weaver was given information about support services today including CSW contact information, information about support team members and programs.   SDOH (Social Determinants of Health) assessments performed: Yes. No needs identified    Family/Social Information:  . Housing Arrangement: patient lives with husband, two children ages 18 & 63 yo  . Relationship status: married       Family members/support persons in your life? Husband, kids, mom . Transportation to appointments provided by self, husband . Financial concerns: No  o Are you able to meet your monthly expenses or do you feel financially stressed? Able to meet bills o Are you concerned about future financial problems due to your illness or keeping your job and income through treatment? No . Employment: Working full time as an Therapist, sports for [employer] source: Employment . Food Security: no concerns . Medication Concerns: no . Services Currently in place:  n/a  . Concerns about diagnosis and/or treatment: trying to explore reconstruction options . Patient reported stressors: no additional stressors other than processing diagnosis and need for mastectomy    SUMMARY: Current SDOH Barriers:  . None identified  Clinical Social Work Clinical Goal(s):  Marland Kitchen Patient will continue to attend medical appointments  Interventions: . Patient interviewed and SDOH assessment performed . Referred patient for Al[facility] guide   Follow Up Plan: Client will attend appointments and Alight guide will call patient Patient verbalizes understanding of plan: Yes    Edwinna Areola Bence Trapp

## 2019-08-19 NOTE — Therapy (Signed)
[place], [place], [address] Phone: [phone]   Fax:  [phone]  Physical Therapy Evaluation  Patient Details  Name: Bethany Weaver MRN: [id number] Date of Birth: 12-19-1980 Referring Provider (PT): Dr. Donnie Mesa   Encounter Date: 08/19/2019   PT End of Session - 08/19/19 1113    Visit Number 1    Number of Visits 2    Date for PT Re-Evaluation 10/14/19    PT Start Time 4801    PT Stop Time 1034    PT Time Calculation (min) 32 min    Activity Tolerance Patient tolerated treatment well    Behavior During Therapy [facility] for tasks assessed/performed           Past Medical History:  Diagnosis Date  . Cyst, ovarian   . SVT (supraventricular tachycardia) ([facility])     History reviewed. No pertinent surgical history.  There were no vitals filed for this visit.    Subjective Assessment - 08/19/19 1054    Subjective Patient reports she is here today to be seen by her medical team for her newly diagnosed left breast cancer.    Patient is accompained by: Family member    Pertinent History Patient was diagnosed on 07/27/2019 with left high grade DCIS breast cancer. Calcifications measure 4.9 cm and is located in the lower outer quadrant.    Patient Stated Goals reduce lymphedema risk and learn post op shoulder ROM HEP    Currently in Pain? No/denies              [facility] PT Assessment - 08/19/19 0001      Assessment   Medical Diagnosis Left breast cancer    Referring Provider (PT) Dr. Donnie Mesa    Onset Date/Surgical Date 07/27/19    Hand Dominance Right    Prior Therapy none      Precautions   Precautions Other (comment)    Precaution Comments active cancer      Restrictions   Weight Bearing Restrictions No      Balance Screen   Has the patient fallen in the past 6 months No    Has the patient had a decrease in activity level because of a fear of falling?  No    Is the  patient reluctant to leave their home because of a fear of falling?  No      Home Social worker Private residence    Living Arrangements Children;Spouse/significant other   Husband, 40 and 32 y.o. kids   Available Help at Discharge Family      Prior Function   Level of Independence Independent    Vocation Full time employment    Chief Technology Officer; works from home    Leisure She exercises at the [employer] 3x/week - swim, cardio, walks       Cognition   Overall Cognitive Status Within Functional Limits for tasks assessed      Posture/Postural Control   Posture/Postural Control Postural limitations    Postural Limitations Rounded Shoulders;Forward head      ROM / Strength   AROM / PROM / Strength AROM;Strength      AROM   Overall AROM Comments Cervical AROM is WNL    AROM Assessment Site Shoulder    Right/Left Shoulder Left;Right    Right Shoulder Extension 45 Degrees    Right Shoulder Flexion 160 Degrees    Right Shoulder ABduction 145 Degrees    Right Shoulder Internal  Rotation 55 Degrees    Right Shoulder External Rotation 67 Degrees    Left Shoulder Extension 50 Degrees    Left Shoulder Flexion 150 Degrees    Left Shoulder ABduction 156 Degrees    Left Shoulder Internal Rotation 50 Degrees    Left Shoulder External Rotation 75 Degrees      Strength   Overall Strength Within functional limits for tasks performed             LYMPHEDEMA/ONCOLOGY QUESTIONNAIRE - 08/19/19 0001      Type   Cancer Type Left breast cancer      Lymphedema Assessments   Lymphedema Assessments Upper extremities      Right Upper Extremity Lymphedema   10 cm Proximal to Olecranon Process 30.9 cm    Olecranon Process 25.8 cm    10 cm Proximal to Ulnar Styloid Process 22.8 cm    Just Proximal to Ulnar Styloid Process 16.6 cm    Across Hand at [employer] 19 cm    At [place] of 2nd Digit 6.2 cm      Left Upper Extremity Lymphedema   10 cm Proximal to Olecranon  Process 32.3 cm    Olecranon Process 25.8 cm    10 cm Proximal to Ulnar Styloid Process 22.8 cm    Just Proximal to Ulnar Styloid Process 16.4 cm    Across Hand at [employer] 18.9 cm    At [place] of 2nd Digit 6.1 cm           L-DEX FLOWSHEETS - 08/19/19 1100      L-DEX LYMPHEDEMA SCREENING   Measurement Type Unilateral    POSITION  Standing    DOMINANT SIDE Right    At Risk Side Left    BASELINE SCORE (UNILATERAL) 3.7            The patient was assessed using the L-Dex machine today to produce a lymphedema index baseline score. The patient will be reassessed on a regular basis (typically every 3 months) to obtain new L-Dex scores. If the score is > 6.5 points away from his/her baseline score indicating onset of subclinical lymphedema, it will be recommended to wear a compression garment for 4 weeks, 12 hours per day and then be reassessed. If the score continues to be > 6.5 points from baseline at reassessment, we will initiate lymphedema treatment. Assessing in this manner has a 95% rate of preventing clinically significant lymphedema.     Katina Dung - 08/19/19 0001    Open a tight or new jar Mild difficulty    Do heavy household chores (wash walls, wash floors) No difficulty    Carry a shopping bag or briefcase No difficulty    Wash your back No difficulty    Use a knife to cut food No difficulty    Recreational activities in which you take some force or impact through your arm, shoulder, or hand (golf, hammering, tennis) No difficulty    During the past week, to what extent has your arm, shoulder or hand problem interfered with your normal social activities with family, friends, neighbors, or groups? Not at all    During the past week, to what extent has your arm, shoulder or hand problem limited your work or other regular daily activities Not at all    Arm, shoulder, or hand pain. None    Tingling (pins and needles) in your arm, shoulder, or hand None    Difficulty  Sleeping No difficulty  DASH Score 2.27 %            Objective measurements completed on examination: See above findings.      Patient was instructed today in a home exercise program today for post op shoulder range of motion. These included active assist shoulder flexion in sitting, scapular retraction, wall walking with shoulder abduction, and hands behind head external rotation.  She was encouraged to do these twice a day, holding 3 seconds and repeating 5 times when permitted by her physician.             PT Education - 08/19/19 1108    Education Details Lymphedema risk reduction and post op shoulder ROM HEP    Person(s) Educated Patient;Spouse    Methods Explanation;Demonstration;Handout    Comprehension Verbalized understanding;Returned demonstration               PT Long Term Goals - 08/19/19 1118      PT LONG TERM GOAL #1   Title Patient will demonstrate she has regained full shoulder ROM and function post opreatively compared to baselines.    Time 8    Period Weeks    Status New    Target Date 10/14/19           Breast Clinic Goals - 08/19/19 1116      Patient will be able to verbalize understanding of pertinent lymphedema risk reduction practices relevant to her diagnosis specifically related to skin care.   Time 1    Period Days    Status Achieved      Patient will be able to return demonstrate and/or verbalize understanding of the post-op home exercise program related to regaining shoulder range of motion.   Time 1    Period Days    Status Achieved      Patient will be able to verbalize understanding of the importance of attending the postoperative After Breast Cancer Class for further lymphedema risk reduction education and therapeutic exercise.   Time 1    Period Days    Status Achieved                 Plan - 08/19/19 1113    Clinical Impression Statement Patient was diagnosed on 07/27/2019 with left high grade DCIS breast  cancer. Calcifications measure 4.9 cm and is located in the lower outer quadrant. Her multidisciplinary medical team met prior to her assessments to determine a recommended treatment plan. She is planning to have a left mastectomy and sentinel node biopsy with breast reconstruction followed by anti-estrogen therapy. She will benefit from a post op PT reassessment to determine needs and from L-Dex screens every 3 months for 2 years to detect subclinical lymphedema.    Stability/Clinical Decision Making Stable/Uncomplicated    Clinical Decision Making Low    Rehab Potential Excellent    PT Frequency --   Eval and 1 f/u visit   PT Treatment/Interventions ADLs/Self Care Home Management;Therapeutic exercise    PT Next Visit Plan Will reassess 3-4 weeks post op to determine needs    PT Home Exercise Plan Post op shoulder ROM HEP    Consulted and Agree with Plan of Care Patient;Family member/caregiver    Family Member Consulted Husband           Patient will benefit from skilled therapeutic intervention in order to improve the following deficits and impairments:  Postural dysfunction, Decreased range of motion, Decreased knowledge of precautions, Impaired UE functional use, Pain  Visit Diagnosis: Ductal carcinoma  in situ (DCIS) of left breast - Plan: PT plan of care cert/re-cert  Abnormal posture - Plan: PT plan of care cert/re-cert   Patient will follow up at outpatient cancer rehab 3-4 weeks following surgery.  If the patient requires physical therapy at that time, a specific plan will be dictated and sent to the referring physician for approval. The patient was educated today on appropriate basic range of motion exercises to begin post operatively and the importance of attending the After Breast Cancer class following surgery.  Patient was educated today on lymphedema risk reduction practices as it pertains to recommendations that will benefit the patient immediately following surgery.  She  verbalized good understanding.      Problem List Patient Active Problem List   Diagnosis Date Noted  . 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 Family history of ovarian cancer 12/16/2018  . Pelvic pain 12/16/2018  . Scoliosis 03/06/2018  . Chronic constipation 03/06/2018  . Female cystocele 03/06/2018  . Annual physical exam 03/06/2018   Annia Friendly, PT 08/19/19 11:23 AM  [place], [place], [address] Phone: [phone]   Fax:  [phone]  Name: Trinitie Mcgirr MRN: [id number] Date of Birth: Apr 12, 1980

## 2019-08-20 ENCOUNTER — Encounter: Payer: Self-pay | Admitting: *Deleted

## 2019-08-20 ENCOUNTER — Telehealth: Payer: Self-pay | Admitting: *Deleted

## 2019-08-20 ENCOUNTER — Telehealth: Payer: Self-pay | Admitting: Hematology and Oncology

## 2019-08-20 NOTE — Telephone Encounter (Signed)
Sent referrals for plastic surgery to Dr. Dewaine Conger @[facility], Dr. Dorann Ou @ [facility], and Dr. Morley Kos at [facility] per patient request.  Patient notified that referrals were sent

## 2019-08-20 NOTE — Telephone Encounter (Signed)
No 8/11 los, no changes made to pt schedule

## 2019-08-21 ENCOUNTER — Other Ambulatory Visit: Payer: Self-pay | Admitting: Surgery

## 2019-08-21 ENCOUNTER — Other Ambulatory Visit: Payer: Self-pay | Admitting: Obstetrics and Gynecology

## 2019-08-21 DIAGNOSIS — D0512 Intraductal carcinoma in situ of left breast: Secondary | ICD-10-CM

## 2019-08-22 ENCOUNTER — Ambulatory Visit
Admission: RE | Admit: 2019-08-22 | Discharge: 2019-08-22 | Disposition: A | Discharge status: Home / Self Care | Payer: 59 | Source: Ambulatory Visit | Attending: Surgery | Admitting: Surgery

## 2019-08-22 ENCOUNTER — Other Ambulatory Visit: Payer: Self-pay

## 2019-08-22 DIAGNOSIS — D0512 Intraductal carcinoma in situ of left breast: Secondary | ICD-10-CM

## 2019-08-22 MED ORDER — GADOBUTROL 1 MMOL/ML IV SOLN
8.0000 mL | Freq: Once | INTRAVENOUS | Status: AC | PRN
  Administered 2019-08-22: 8 mL via INTRAVENOUS

## 2019-08-25 ENCOUNTER — Other Ambulatory Visit: Payer: Self-pay | Admitting: Surgery

## 2019-08-25 ENCOUNTER — Ambulatory Visit (HOSPITAL_COMMUNITY): Payer: 59

## 2019-08-25 DIAGNOSIS — N6314 Unspecified lump in the right breast, lower inner quadrant: Secondary | ICD-10-CM

## 2019-08-25 DIAGNOSIS — D0512 Intraductal carcinoma in situ of left breast: Secondary | ICD-10-CM

## 2019-08-26 ENCOUNTER — Encounter: Payer: Self-pay | Admitting: Genetic Counselor

## 2019-08-26 ENCOUNTER — Telehealth: Payer: Self-pay | Admitting: Genetic Counselor

## 2019-08-26 ENCOUNTER — Ambulatory Visit: Payer: Self-pay | Admitting: Genetic Counselor

## 2019-08-26 ENCOUNTER — Other Ambulatory Visit: Payer: Self-pay | Admitting: Surgery

## 2019-08-26 ENCOUNTER — Telehealth: Payer: Self-pay | Admitting: *Deleted

## 2019-08-26 DIAGNOSIS — Z1379 Encounter for other screening for genetic and chromosomal anomalies: Secondary | ICD-10-CM | POA: Insufficient documentation

## 2019-08-26 DIAGNOSIS — N6314 Unspecified lump in the right breast, lower inner quadrant: Secondary | ICD-10-CM

## 2019-08-26 DIAGNOSIS — D0512 Intraductal carcinoma in situ of left breast: Secondary | ICD-10-CM

## 2019-08-26 DIAGNOSIS — Z8042 Family history of malignant neoplasm of prostate: Secondary | ICD-10-CM

## 2019-08-26 NOTE — Progress Notes (Signed)
HPI:  [name] was previously seen in the [place] clinic due to a personal history of DCIS diagnosed at age 39, a family history of metastatic prostate cancer and breast cancer, and concerns regarding a hereditary predisposition to cancer. Please refer to our prior cancer genetics clinic note for more information regarding our discussion, assessment and recommendations, at the time. [name] recent genetic test results were disclosed to her, as were recommendations warranted by these results. These results and recommendations are discussed in more detail below.  CANCER HISTORY:  Oncology History  Ductal carcinoma in situ (DCIS) of left breast  08/17/2019 Initial Diagnosis   Screening mammogram showed 2 right breast masses and microcalcifications in the left breast. Diagnostic mammogram showed calcifications in the left breast spanning 4.9cm, and two adjacent cysts in the right breast. Left breast biopsy showed DCIS, high grade, ER+ 100%, PR+ 50%.   08/26/2019 Genetic Testing   No pathogenic variants detected in [facility] Common Hereditary Cancers Panel.  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 The Common Hereditary Cancers Panel offered by [facility] includes sequencing and/or deletion duplication testing of the following 48 genes: APC, ATM, AXIN2, BARD1, BMPR1A, BRCA1, BRCA2, BRIP1, CDH1, CDK4, CDKN2A (p14ARF), CDKN2A (p16INK4a), CHEK2, CTNNA1, DICER1, EPCAM (Deletion/duplication testing only), GREM1 (promoter region deletion/duplication testing only), KIT, MEN1, MLH1, MSH2, MSH3, MSH6, MUTYH, NBN, NF1, NHTL1, PALB2, PDGFRA, PMS2, POLD1, POLE, PTEN, RAD50, RAD51C, RAD51D, RNF43, SDHB, SDHC, SDHD, SMAD4, SMARCA4. STK11, TP53, TSC1, TSC2, and VHL.  The following genes were evaluated for sequence changes only: SDHA and HOXB13 c.251G>A variant only.  The report date is August 26, 2019.      FAMILY HISTORY:  We obtained a detailed,  4-generation family history.  Significant diagnoses are listed below: Family History  Problem Relation Age of Onset  . Hypotension Mother   . Heart disease Father   . Diabetes Father   . Prostate cancer Father        dx 51s  . Leukemia Maternal Grandmother   . Prostate cancer Maternal Grandfather        metastatic; dx 34s  . Prostate cancer Maternal Uncle        metastatic; dx 30s  . Prostate cancer Paternal Uncle        metastatic; dx 25s  . Cancer Paternal Grandmother        unknown type; dx 15s       [name] has two children, ages 49 and 33, both without a cancer history.  She has three sisters, none of whom have had cancer.  Her mother is living at age 41.  [name] has one maternal uncle who had metastatic prostate cancer diagnosed in his 38s and passed away in his 72s.  [name] maternal grandfather had metastatic prostate cancer diagnosed in his 38s, and her maternal grandmother passed away from leukemia, diagnosed in her 65s.  [name] also has a maternal great aunt and a distant maternal cousin with a breast cancer history, diagnosed in their 38s and 56s, respectively.  [name] father is living at age 25.  He was diagnosed with prostate cancer in his 21s.  [name] paternal half uncle was diagnosed with metastatic prostate cancer and passed away in his 61s.  [name] maternal grandmother had cancer of an unknown type diagnosed in her 74s.  No other cancer history was reported in the family.  [name] is unaware of previous family history of genetic testing  for hereditary cancer risks. Patient's maternal ancestors are of African American descent, and paternal ancestors are of African American descent. There is no reported Ashkenazi Jewish ancestry. There is no known consanguinity.  GENETIC TEST RESULTS: Genetic testing reported out on August 26, 2019. The [facility] Common Hereditary Cancers Panel through found no pathogenic mutations. The  Common Hereditary Cancers Panel offered by [facility] includes sequencing and/or deletion duplication testing of the following 48 genes: APC, ATM, AXIN2, BARD1, BMPR1A, BRCA1, BRCA2, BRIP1, CDH1, CDK4, CDKN2A (p14ARF), CDKN2A (p16INK4a), CHEK2, CTNNA1, DICER1, EPCAM (Deletion/duplication testing only), GREM1 (promoter region deletion/duplication testing only), KIT, MEN1, MLH1, MSH2, MSH3, MSH6, MUTYH, NBN, NF1, NHTL1, PALB2, PDGFRA, PMS2, POLD1, POLE, PTEN, RAD50, RAD51C, RAD51D, RNF43, SDHB, SDHC, SDHD, SMAD4, SMARCA4. STK11, TP53, TSC1, TSC2, and VHL.  The following genes were evaluated for sequence changes only: SDHA and HOXB13 c.251G>A variant only. The test report has been scanned into EPIC and is located under the Molecular Pathology section of the Results Review tab.  A portion of the result report is included below for reference.     We discussed with [name] that because current genetic testing is not perfect, it is possible there may be a gene mutation in one of these genes that current testing cannot detect, but that chance is small.  We also discussed, that there could be another gene that has not yet been discovered, or that we have not yet tested, that is responsible for the cancer diagnoses in the family. It is also possible there is a hereditary cause for the cancer in the family that [name] did not inherit and therefore was not identified in her testing.  Therefore, it is important to remain in touch with cancer genetics in the future so that we can continue to offer [name] the most up to date genetic testing.   Genetic testing did identify a variant of uncertain significance (VUS) was identified in the SDHB gene called c.455C>T (p.Ser152Phe).  At this time, it is unknown if this variant is associated with increased cancer risk or if this is a normal finding, but most variants such as this get reclassified to being inconsequential. It should not be used to make medical  management decisions. With time, we suspect the lab will determine the significance of this variant, if any. If we do learn more about it, we will try to contact [name] to discuss it further. However, it is important to stay in touch with [place] periodically and keep the address and phone number up to date.  ADDITIONAL GENETIC TESTING: We discussed with [name] that there are other genes that are associated with increased cancer risk that can be analyzed. Should [name] wish to pursue additional genetic testing, we are happy to discuss and coordinate this testing, at any time.    CANCER SCREENING RECOMMENDATIONS: [name] test result is considered negative (normal).  This means that we have not identified a hereditary cause for her personal history of DCIS and family history of breast and metastatic prostate cancer at this time. Most cancers happen by chance and this negative test suggests that her cancer may fall into this category.    While reassuring, this does not definitively rule out a hereditary predisposition to cancer. It is still possible that there could be genetic mutations that are undetectable by current technology. There could be genetic mutations in genes that have not been tested or identified to increase cancer risk.  Therefore, it is recommended she continue  to follow the cancer management and screening guidelines provided by her oncology and primary healthcare provider.   An individual's cancer risk and medical management are not determined by genetic test results alone. Overall cancer risk assessment incorporates additional factors, including personal medical history, family history, and any available genetic information that may result in a personalized plan for cancer prevention and surveillance  RECOMMENDATIONS FOR FAMILY MEMBERS:  Individuals in this family might be at some increased risk of developing cancer, over the general population risk, simply due to the  family history of cancer.  We recommended women in this family have a yearly mammogram beginning at age 61, or 27 years younger than the earliest onset of cancer, an annual clinical breast exam, and perform monthly breast self-exams. Women in this family should also have a gynecological exam as recommended by their primary provider. All family members should be referred for colonoscopy starting at age 35.   It is also possible there is a hereditary cause for the cancer in Ms. Williams's family that she did not inherit and therefore was not identified in her.  Based on Ms. Williams's family history, we recommended that first degree relatives of those who had metastatic prostate cancer have genetic counseling and testing. [name] will let us know if we can be of any assistance in coordinating genetic counseling and/or testing for this family member.   FOLLOW-UP: Lastly, we discussed with [name] that cancer genetics is a rapidly advancing field and it is possible that new genetic tests will be appropriate for her and/or her family members in the future. We encouraged her to remain in contact with cancer genetics on an annual basis so we can update her personal and family histories and let her know of advances in cancer genetics that may benefit this family.   Our contact number was provided. [name] questions were answered to her satisfaction, and she knows she is welcome to call us at anytime with additional questions or concerns.   Bethany Weaver M. Joette Catching, [email or web address] (P) [phone]

## 2019-08-26 NOTE — Progress Notes (Unsigned)
Nutrition  Patient identified by attended Breast Clinic on 08/19/2019.  Patient was given nutrition packet of information by nurse navigator at this meeting with RD contact information.   Chart reviewed.   39 year old female with new diagnosis of breast cancer.  Planning mastectomy with reconstruction, followed by antiestrogens.   Ht: 63 inches Wt: 182 lb BMI: 32  Patient currently not at nutritional risk.  RD available if changes in nutritional status occur.    Shawnell Dykes B. Zenia Resides, [place], [place] Registered Dietitian [phone] (mobile)

## 2019-08-26 NOTE — Telephone Encounter (Signed)
Revealed that no pathogenic variants were detected in [facility] 48 gene panel.  Discussed that we do not know why she has DCIS or why there is cancer in the family. It could be due to a different gene that we are not testing, or maybe our current technology may not be able to pick something up.  It will be important for her to keep in contact with genetics to keep up with whether additional testing may be needed. Revealed variant of uncertain significance (VUS) in SDHB.

## 2019-08-26 NOTE — Telephone Encounter (Signed)
Received call from pt stating having right LOQ abd pain x1 wk that has continued to get worse. Per Dr. Lindi Adie CT cap ordered. Pt notified of orders.

## 2019-08-27 ENCOUNTER — Encounter: Payer: Self-pay | Admitting: *Deleted

## 2019-08-27 ENCOUNTER — Telehealth: Payer: Self-pay | Admitting: *Deleted

## 2019-08-27 NOTE — Telephone Encounter (Signed)
Spoke with patient to follow up from [facility] last week.  Patient is scheduled for 2nd opinion at [facility] for sx and plastics on 8/31.  She is also scheduled for CT on 9/1 and is requesting this be moved to hospital if it can be done sooner than 9/1.  She understands she will have to pay more out of pocket to be done at a hospital based facility.  Informed I would check 1st to see if GI has anything sooner and if not see what the hospital has available and let her know. She verbalized understanding.

## 2019-08-28 ENCOUNTER — Telehealth: Payer: Self-pay | Admitting: Hematology and Oncology

## 2019-08-28 NOTE — Telephone Encounter (Signed)
Released last OV note to [facility] to [phone]   Release:  [phone]

## 2019-08-31 ENCOUNTER — Ambulatory Visit
Admission: RE | Admit: 2019-08-31 | Discharge: 2019-08-31 | Disposition: A | Discharge status: Home / Self Care | Payer: 59 | Source: Ambulatory Visit | Attending: Surgery | Admitting: Surgery

## 2019-08-31 ENCOUNTER — Other Ambulatory Visit: Payer: Self-pay

## 2019-08-31 ENCOUNTER — Other Ambulatory Visit: Payer: Self-pay | Admitting: Surgery

## 2019-08-31 DIAGNOSIS — D0512 Intraductal carcinoma in situ of left breast: Secondary | ICD-10-CM

## 2019-09-01 ENCOUNTER — Telehealth: Payer: Self-pay | Admitting: *Deleted

## 2019-09-01 ENCOUNTER — Encounter: Payer: Self-pay | Admitting: *Deleted

## 2019-09-01 NOTE — Telephone Encounter (Signed)
Received call from patient stating her abdominal pain had subsided and seems to be associated with her menstrual cycle.  Her scans had been denied by her insurance.  Instructed her to follow up with her OBGYN as well if she had continued pain assoc with her cycle.  Patient verbalized understanding.  Scans have been cancelled.

## 2019-09-02 ENCOUNTER — Other Ambulatory Visit: Payer: 59

## 2019-09-04 ENCOUNTER — Other Ambulatory Visit: Payer: Self-pay

## 2019-09-04 ENCOUNTER — Ambulatory Visit
Admission: RE | Admit: 2019-09-04 | Discharge: 2019-09-04 | Disposition: A | Discharge status: Home / Self Care | Payer: 59 | Source: Ambulatory Visit | Attending: Surgery | Admitting: Surgery

## 2019-09-04 DIAGNOSIS — N6314 Unspecified lump in the right breast, lower inner quadrant: Secondary | ICD-10-CM

## 2019-09-04 MED ORDER — GADOBUTROL 1 MMOL/ML IV SOLN
8.0000 mL | Freq: Once | INTRAVENOUS | Status: AC | PRN
  Administered 2019-09-04: 8 mL via INTRAVENOUS

## 2019-09-08 ENCOUNTER — Encounter: Payer: Self-pay | Admitting: *Deleted

## 2019-09-09 ENCOUNTER — Other Ambulatory Visit: Payer: 59

## 2019-09-09 HISTORY — PX: MASTECTOMY: SHX3

## 2019-09-11 ENCOUNTER — Telehealth: Payer: Self-pay | Admitting: *Deleted

## 2019-09-11 ENCOUNTER — Encounter: Payer: Self-pay | Admitting: *Deleted

## 2019-09-11 NOTE — Telephone Encounter (Signed)
Received call back from patient stating she is having her mast/rec on 10/18 at [facility] but they are going to do the [facility] sx on 9/10. Informed her I would send a message to get her scheduled back with Dr. Lindi Adie a couple weeks after sx.  Patient verbalized understanding.  Schedule msg sent.

## 2019-09-11 NOTE — Telephone Encounter (Signed)
Left message for a return phone call to follow up from her appointment at [facility].

## 2019-10-01 ENCOUNTER — Encounter: Payer: Self-pay | Admitting: *Deleted

## 2019-10-04 NOTE — Progress Notes (Signed)
Patient Care Team: Patient, No Pcp Per as PCP - General (General Practice) Donnie Mesa, MD as Consulting Physician (General Surgery) Nicholas Lose, MD as Consulting Physician (Hematology and Oncology) Gery Pray, MD as Consulting Physician (Radiation Oncology) Mauro Kaufmann, RN as Oncology Nurse Navigator Rockwell Germany, RN as Oncology Nurse Navigator  DIAGNOSIS:    ICD-10-CM   1. Ductal carcinoma in situ (DCIS) of left breast  D05.12     SUMMARY OF ONCOLOGIC HISTORY: Oncology History  Ductal carcinoma in situ (DCIS) of left breast  08/17/2019 Initial Diagnosis   Screening mammogram showed 2 right breast masses and microcalcifications in the left breast. Diagnostic mammogram showed calcifications in the left breast spanning 4.9cm, and two adjacent cysts in the right breast. Left breast biopsy showed DCIS, high grade, ER+ 100%, PR+ 50%.   08/26/2019 Genetic Testing   No pathogenic variants detected in [facility] Common Hereditary Cancers Panel.  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 The Common Hereditary Cancers Panel offered by [facility] includes sequencing and/or deletion duplication testing of the following 48 genes: APC, ATM, AXIN2, BARD1, BMPR1A, BRCA1, BRCA2, BRIP1, CDH1, CDK4, CDKN2A (p14ARF), CDKN2A (p16INK4a), CHEK2, CTNNA1, DICER1, EPCAM (Deletion/duplication testing only), GREM1 (promoter region deletion/duplication testing only), KIT, MEN1, MLH1, MSH2, MSH3, MSH6, MUTYH, NBN, NF1, NHTL1, PALB2, PDGFRA, PMS2, POLD1, POLE, PTEN, RAD50, RAD51C, RAD51D, RNF43, SDHB, SDHC, SDHD, SMAD4, SMARCA4. STK11, TP53, TSC1, TSC2, and VHL.  The following genes were evaluated for sequence changes only: SDHA and HOXB13 c.251G>A variant only.  The report date is August 26, 2019.    09/21/2019 Surgery   Left mastectomy with reconstruction ([facility]): high grade DCIS, 3.5cm, no invasive carcinoma, clear margins, 6 left axillary lymph nodes negative  for carcinoma.      CHIEF COMPLIANT: Follow-up s/p left mastectomy  INTERVAL HISTORY: Bethany Weaver is a 39 y.o. with above-mentioned history of left breast DCIS. She underwent a left mastectomy with reconstruction on 09/21/19 with Dr. Dorann Ou at [facility] for which pathology showed high grade DCIS, 3.5cm, no invasive carcinoma, clear margins, 6 left axillary lymph nodes negative for carcinoma. She presents to the clinic today to discuss the pathology report and further treatment.   ALLERGIES:  is allergic to latex.  MEDICATIONS:  Current Outpatient Medications  Medication Sig Dispense Refill  . Cholecalciferol (VITAMIN D) 50 MCG (2000 UT) CAPS Take 1 capsule by mouth daily.    . hyoscyamine (LEVSIN) 0.125 MG tablet Take 1 tablet (0.125 mg total) by mouth every 8 (eight) hours as needed (abdominal pain). 20 tablet 0  . norgestimate-ethinyl estradiol (ORTHO-CYCLEN) 0.25-35 MG-MCG tablet Take 1 tablet by mouth daily. 1 Package 11  . Turmeric (QC TUMERIC COMPLEX) 500 MG CAPS Take 1 capsule by mouth daily.    . vitamin E (VITAMIN E) 180 MG (400 UNITS) capsule Take 400 Units by mouth daily.     No current facility-administered medications for this visit.    PHYSICAL EXAMINATION: ECOG PERFORMANCE STATUS: 1 - Symptomatic but completely ambulatory  Vitals:   10/05/19 1122  BP: 128/81  Pulse: 89  Resp: 18  Temp: 98.3 F (36.8 C)  SpO2: 98%   Filed Weights   10/05/19 1122  Weight: 177 lb 1.6 oz (80.3 kg)     LABORATORY DATA:  I have reviewed the data as listed CMP Latest Ref Rng & Units 08/19/2019 12/10/2018  Glucose 70 - 99 mg/dL 96 81  BUN 6 - 20 mg/dL 7 11  Creatinine 0.44 -  1.00 mg/dL 0.80 0.81  Sodium 135 - 145 mmol/L 140 139  Potassium 3.5 - 5.1 mmol/L 4.2 3.5  Chloride 98 - 111 mmol/L 107 103  CO2 22 - 32 mmol/L 27 28  Calcium 8.9 - 10.3 mg/dL 8.9 8.9  Total Protein 6.5 - 8.1 g/dL 7.1 7.4  Total Bilirubin 0.3 - 1.2 mg/dL 0.3 0.4  Alkaline Phos 38 - 126 U/L 39 44   AST 15 - 41 U/L 11(L) 16  ALT 0 - 44 U/L 12 23    Lab Results  Component Value Date   WBC 6.5 08/19/2019   HGB 12.0 08/19/2019   HCT 37.0 08/19/2019   MCV 87.7 08/19/2019   PLT 310 08/19/2019   NEUTROABS 4.0 08/19/2019    ASSESSMENT & PLAN:  Ductal carcinoma in situ (DCIS) of left breast 08/17/2019:Screening mammogram showed 2 right breast masses and microcalcifications in the left breast. Diagnostic mammogram showed calcifications in the left breast spanning 4.9cm, and two adjacent cysts in the right breast. Left breast biopsy showed DCIS, high grade, ER+ 100%, PR+ 50%.  09/21/2019: Left mastectomy with D IEP flap reconstruction (at [facility]): High-grade DCIS 3.5 cm ER 100%, PR 100% Treatment plan: Adjuvant tamoxifen x5 years We discussed the risks and benefits of tamoxifen therapy once again. We decided to start her 10 mg daily starting 11/09/2019. If she tolerates it very well she can go up to 20 mg.  If she does not tolerate it then we will drop it to 5 mg. She is very worried about mood swings and hot flashes and the risk of uterus hypertrophy.  She tells me that she has fibroid uterus detected on the pelvic MRI that she had at [facility].  She is planning to see her gynecologist about it.  Since she stopped birth control pills she started having menstrual cycles and the last 2 cycles were fairly heavy.  Return to clinic in 1 month for MyChart virtual visit on December 09, 2019 to evaluate how she is doing on tamoxifen at 10 mg.   No orders of the defined types were placed in this encounter.  The patient has a good understanding of the overall plan. she agrees with it. she will call with any problems that may develop before the next visit here.  Total time spent: 30 mins including face to face time and time spent for planning, charting and coordination of care  Nicholas Lose, MD 10/05/2019  I, Cloyde Reams Dorshimer, am acting as scribe for Dr. Nicholas Lose.  I have reviewed the above  documentation for accuracy and completeness, and I agree with the above.

## 2019-10-05 ENCOUNTER — Other Ambulatory Visit: Payer: Self-pay

## 2019-10-05 ENCOUNTER — Encounter: Payer: Self-pay | Admitting: *Deleted

## 2019-10-05 ENCOUNTER — Telehealth: Payer: Self-pay | Admitting: Hematology and Oncology

## 2019-10-05 ENCOUNTER — Inpatient Hospital Stay: Payer: 59 | Attending: Hematology and Oncology | Admitting: Hematology and Oncology

## 2019-10-05 DIAGNOSIS — R590 Localized enlarged lymph nodes: Secondary | ICD-10-CM | POA: Diagnosis not present

## 2019-10-05 DIAGNOSIS — Z79899 Other long term (current) drug therapy: Secondary | ICD-10-CM | POA: Diagnosis not present

## 2019-10-05 DIAGNOSIS — N6310 Unspecified lump in the right breast, unspecified quadrant: Secondary | ICD-10-CM | POA: Insufficient documentation

## 2019-10-05 DIAGNOSIS — Z9012 Acquired absence of left breast and nipple: Secondary | ICD-10-CM | POA: Diagnosis not present

## 2019-10-05 DIAGNOSIS — Z170 Estrogen receptor positive status [ER+]: Secondary | ICD-10-CM | POA: Insufficient documentation

## 2019-10-05 DIAGNOSIS — D0512 Intraductal carcinoma in situ of left breast: Secondary | ICD-10-CM | POA: Diagnosis present

## 2019-10-05 MED ORDER — TAMOXIFEN CITRATE 10 MG PO TABS: 10.0000 mg | ORAL_TABLET | Freq: Every day | ORAL | 0 refills | Status: DC

## 2019-10-05 NOTE — Assessment & Plan Note (Signed)
08/17/2019:Screening mammogram showed 2 right breast masses and microcalcifications in the left breast. Diagnostic mammogram showed calcifications in the left breast spanning 4.9cm, and two adjacent cysts in the right breast. Left breast biopsy showed DCIS, high grade, ER+ 100%, PR+ 50%.  09/21/2019: Left mastectomy with D IEP flap reconstruction (at [facility]) Treatment plan: Adjuvant tamoxifen x5 years  Return to clinic in 3 months for survivorship care plan visit

## 2019-10-05 NOTE — Telephone Encounter (Signed)
Scheduled appts per 9/27 los. Pt declined print out of AVS and stated she would refer to mychart.

## 2019-10-09 ENCOUNTER — Encounter: Payer: Self-pay | Admitting: *Deleted

## 2019-10-27 ENCOUNTER — Other Ambulatory Visit: Payer: Self-pay

## 2019-10-27 ENCOUNTER — Encounter: Payer: Self-pay | Admitting: Rehabilitation

## 2019-10-27 ENCOUNTER — Ambulatory Visit: Payer: 59 | Attending: Surgery | Admitting: Rehabilitation

## 2019-10-27 DIAGNOSIS — D0512 Intraductal carcinoma in situ of left breast: Secondary | ICD-10-CM | POA: Insufficient documentation

## 2019-10-27 DIAGNOSIS — M25612 Stiffness of left shoulder, not elsewhere classified: Secondary | ICD-10-CM | POA: Diagnosis present

## 2019-10-27 DIAGNOSIS — R293 Abnormal posture: Secondary | ICD-10-CM | POA: Diagnosis present

## 2019-10-27 DIAGNOSIS — Z483 Aftercare following surgery for neoplasm: Secondary | ICD-10-CM | POA: Insufficient documentation

## 2019-10-27 NOTE — Patient Instructions (Signed)
Scar Massage  Scar massage is done to improve the mobility of scar, decrease scar tissue from building up, reduce adhesions, and prevent Keloids from forming. Start scar massage after scabs have fallen off by themselves and no open areas. The first few weeks after surgery, it is normal for a scar to appear pink or red and slightly raised. Scars can itch or have areas of numbness. Some scars may be sensitive.   Direct Scar massage: after scar is healed, no opening, no scab 1.  Place pads of two fingers together directly on the scar starting at one end of the scar. Move the fingers up and down across the scar holding 5 seconds one direction.  Then go opposite direction hold 5 seconds.  2. Move over to the next section of the scar and repeat.  Work your way along the entire length of the scar.   3. Next make diagonal movements along the scar holding 5 seconds at one direction. 4. Next movement is side to side. 5. Do not rub fingers over the scar.  Instead keep firm pressure and move scar over the tissue it is on top   Scar Lift and Roll 12 weeks after surgery. 1. Pinch a small amount of the scar between your first two fingers and thumb.  2. Roll the scar between your fingers for 5 to 15 seconds. 3. Move along the scar and repeat until you have massaged the entire length of scar.   Stop the massage and call your doctor if you notice: 1. Increased redness 2. Bleeding from scar 3. Seepage coming from the scar 4. Scar is warmer and has increased pain    

## 2019-10-27 NOTE — Therapy (Signed)
[place], [place], [address] Phone: [phone]   Fax:  [phone]  Physical Therapy Evaluation  Patient Details  Name: Bethany Weaver MRN: [id number] Date of Birth: [birth year] Referring Provider (PT): Coral Ceo PA-C    Encounter Date: 10/27/2019   PT End of Session - 10/27/19 1228    Visit Number 2    Number of Visits 14    Date for PT Re-Evaluation 12/08/19    PT Start Time 1103    PT Stop Time 1154    PT Time Calculation (min) 51 min    Activity Tolerance Patient tolerated treatment well    Behavior During Therapy [facility] for tasks assessed/performed           Past Medical History:  Diagnosis Date  . Cyst, ovarian   . SVT (supraventricular tachycardia) ([place])     History reviewed. No pertinent surgical history.  There were no vitals filed for this visit.    Subjective Assessment - 10/27/19 1105    Subjective I still use my binder.  Not alot of breast pain more in the armpit.  I can reach a bit more now.  I am lifted on restrictions just no cruches x 4-6 months.  I can wear the binder as much as I need.  and any bra just no underwire    Pertinent History Left mastectomy and immediate DIEP flap on 09/20/19 by Dr. Dorann Ou at [facility] due to left breast cancer with removal of 6 lymph nodes all negative.  No radiation needed and no chemotherapy.    Limitations Lifting   I am just nervous about the incision moving wrong   Patient Stated Goals get back to normal    Currently in Pain? No/denies   The armpit hurts randomly when I do too much             [facility] PT Assessment - 10/27/19 0001      Assessment   Medical Diagnosis Left breast cancer    Referring Provider (PT) Coral Ceo PA-C     Onset Date/Surgical Date 09/21/19    Hand Dominance Right    Prior Therapy none      Precautions   Precaution Comments lymphedema, no crunches for 4-6 months      Restrictions   Weight  Bearing Restrictions No      Balance Screen   Has the patient fallen in the past 6 months No    Has the patient had a decrease in activity level because of a fear of falling?  No    Is the patient reluctant to leave their home because of a fear of falling?  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     Leisure She exercises at the [employer] 3x/week - swim, cardio, walks       Cognition   Overall Cognitive Status Within Functional Limits for tasks assessed      Observation/Other Assessments   Observations wearing abdominal binder;  well healed abdominal incision with dog ear more left than right, oval skin flap incision middle breast well healed       Sensation  Additional Comments numbness left axilla       Coordination   Gross Motor Movements are Fluid and Coordinated No      Posture/Postural Control   Postural Limitations Rounded Shoulders;Forward head      AROM   Left Shoulder Extension 50 Degrees    Left Shoulder Flexion 142 Degrees    Left Shoulder ABduction 124 Degrees    Left Shoulder External Rotation 75 Degrees      Palpation   Palpation comment abdominal incision soft with soft scar edges mid belly and a bit harder and more hard edges laterally on both sides.               LYMPHEDEMA/ONCOLOGY QUESTIONNAIRE - 10/27/19 0001      Left Upper Extremity Lymphedema   10 cm Proximal to Olecranon Process 32.6 cm    Olecranon Process 27.3 cm    10 cm Proximal to Ulnar Styloid Process 22.3 cm    Just Proximal to Ulnar Styloid Process 15.8 cm    Across Hand at [employer] 19.3 cm    At [place] of 2nd Digit 6.1 cm                 Quick Dash - 10/27/19 0001    Open a tight  or new jar Moderate difficulty    Do heavy household chores (wash walls, wash floors) No difficulty    Carry a shopping bag or briefcase Mild difficulty    Wash your back No difficulty    Use a knife to cut food No difficulty    Recreational activities in which you take some force or impact through your arm, shoulder, or hand (golf, hammering, tennis) Unable    During the past week, to what extent has your arm, shoulder or hand problem interfered with your normal social activities with family, friends, neighbors, or groups? Slightly    During the past week, to what extent has your arm, shoulder or hand problem limited your work or other regular daily activities Not at all    Arm, shoulder, or hand pain. Mild    Tingling (pins and needles) in your arm, shoulder, or hand Mild    Difficulty Sleeping Moderate difficulty    DASH Score 27.27 %            Objective measurements completed on examination: See above findings.       [facility] Adult PT Treatment/Exercise - 10/27/19 0001      Self-Care   Self-Care Other Self-Care Comments    Other Self-Care Comments  reviewed scar massage over the abdominal incision as pt has been doing a bit of this      Exercises   Exercises Other Exercises    Other Exercises  reviewed post op stretches which pt had received but had never started       Manual Therapy   Manual Therapy Edema management    Edema Management gave pt prescription for bras and/or compression garment which she was interested in for exercise and flying                  PT Education - 10/27/19 1227    Education Details lymphedema review and use of compression sleeve and bra, initial HEP review    Person(s) Educated Patient    Methods Explanation;Demonstration;Tactile cues;Verbal cues    Comprehension Verbalized understanding               PT Long Term Goals - 10/27/19 1238  PT LONG TERM GOAL #1   Title Patient will demonstrate she has regained full shoulder  ROM and function post opreatively compared to baselines. (flexion: 150 and abduction 156)    Baseline on 10/27/19: Flexion 142, Abduction: 124    Time 6    Period Weeks    Status New      PT LONG TERM GOAL #2   Title Pt will decrease QDASH to baseline of 2%    Baseline 27%    Time 6    Period Weeks    Status New      PT LONG TERM GOAL #3   Title Pt will demonstrate ability to perform continued abdominal incision work    Time 6    Period Weeks    Status New      PT LONG TERM GOAL #4   Title Pt will report no limitations with pulling in the trunk with ADLs    Time 6    Period Weeks    Status New      PT LONG TERM GOAL #5   Title Pt will be ind with HEP or gym independence    Time 6    Period Weeks    Status New                  Plan - 10/27/19 1229    Clinical Impression Statement Pt presents 5 weeks post DIEP flat/mastectomy with minimal pain, some scar adhesions, decreased ROM, axillary discomfort, and decreased activity tolerance / exercise tolerance.    Personal Factors and Comorbidities Comorbidity 1    Comorbidities lymph node removal    Examination-Activity Limitations Lift;Carry    Examination-Participation Restrictions Art gallery manager;Yard Work    Stability/Clinical Decision Making Stable/Uncomplicated    Designer, jewellery Low    Rehab Potential Excellent    PT Frequency --   1-2x per week   PT Duration 6 weeks    PT Treatment/Interventions ADLs/Self Care Home Management;Therapeutic exercise;Patient/family education;Manual lymph drainage;Manual techniques;Scar mobilization;Taping    PT Next Visit Plan left shoulder AAROM, PROM, abdominal scar work as tolerated, MLD for the lateral breast*No crunches/intense work focus until 4-6 months post op per Psychologist, sport and exercise*    PT Home Exercise Plan Post op shoulder ROM HEP    Recommended Other Services bras and sleeve    Consulted and Agree with Plan of Care Patient           Patient will benefit  from skilled therapeutic intervention in order to improve the following deficits and impairments:  Postural dysfunction, Decreased range of motion, Decreased knowledge of precautions, Impaired UE functional use, Pain, Decreased strength, Increased fascial restricitons  Visit Diagnosis: Abnormal posture  Stiffness of left shoulder, not elsewhere classified  Aftercare following surgery for neoplasm     Problem List Patient Active Problem List   Diagnosis Date Noted  . Genetic testing 08/26/2019  . Family history of prostate cancer 08/19/2019  . 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 Family history of ovarian cancer 12/16/2018  . Pelvic pain 12/16/2018  . Scoliosis 03/06/2018  . Chronic constipation 03/06/2018  . Female cystocele 03/06/2018  . Annual physical exam 03/06/2018    Shan Levans, PT 10/27/2019, 12:42 PM  [place], [place], [address] Phone: [phone]   Fax:  [phone]  Name: Cloteal Isaacson MRN: [id number] Date of Birth: 1980-11-01

## 2019-10-30 ENCOUNTER — Ambulatory Visit: Payer: 59 | Admitting: Rehabilitation

## 2019-11-03 ENCOUNTER — Ambulatory Visit: Payer: 59

## 2019-11-03 ENCOUNTER — Other Ambulatory Visit: Payer: Self-pay

## 2019-11-03 DIAGNOSIS — D0512 Intraductal carcinoma in situ of left breast: Secondary | ICD-10-CM

## 2019-11-03 DIAGNOSIS — R293 Abnormal posture: Secondary | ICD-10-CM | POA: Diagnosis not present

## 2019-11-03 DIAGNOSIS — Z483 Aftercare following surgery for neoplasm: Secondary | ICD-10-CM

## 2019-11-03 DIAGNOSIS — M25612 Stiffness of left shoulder, not elsewhere classified: Secondary | ICD-10-CM

## 2019-11-03 NOTE — Patient Instructions (Signed)
Started introducing pt to MLD techniques and gentle stretch.  Verbally reviewed reasoning and how to activate LN.  Pt. Practiced pathways. Will need review

## 2019-11-03 NOTE — Therapy (Signed)
[place], [place], [address] Phone: [phone]   Fax:  [phone]  Physical Therapy Treatment  Patient Details  Name: Bethany Weaver MRN: [id number] Date of Birth: Jun 17, 1980 Referring Provider (PT): Coral Ceo PA-C    Encounter Date: 11/03/2019   PT End of Session - 11/03/19 1217    Visit Number 3    Number of Visits 14    Date for PT Re-Evaluation 12/08/19    PT Start Time 1103    PT Stop Time 1200    PT Time Calculation (min) 57 min    Activity Tolerance Patient tolerated treatment well    Behavior During Therapy [facility] for tasks assessed/performed           Past Medical History:  Diagnosis Date  . Cyst, ovarian   . SVT (supraventricular tachycardia) ([place])     History reviewed. No pertinent surgical history.  There were no vitals filed for this visit.   Subjective Assessment - 11/03/19 1102    Subjective Pt reports compliance with exs.  Still has some random pains out of the blue.Can still feel swelling at lateral trunk when arms are at her side.    Pertinent History Left mastectomy and immediate DIEP flap on 09/20/19 by Dr. Dorann Ou at [facility] due to left breast cancer with removal of 6 lymph nodes all negative.  No radiation needed and no chemotherapy.    Currently in Pain? No/denies                             [facility] Adult PT Treatment/Exercise - 11/03/19 0001      Self-Care   Other Self-Care Comments  reviewed scar massage over the abdominal incision as pt has been doing a bit of this      Exercises   Other Exercises  lower trunk rotation for gentle stretch      Shoulder Exercises: Supine   Other Supine Exercises AA shoulder flexion x5    Other Supine Exercises gentle ER stretch x 3      Shoulder Exercises: Stretch   Wall Stretch - Flexion 3 reps    Wall Stretch - Flexion Limitations very limited    Wall Stretch - ABduction 4 reps    Wall Stretch -  ABduction Limitations very limited      Manual Therapy   Manual Therapy Edema management;Soft tissue mobilization;Myofascial release;Manual Lymphatic Drainage (MLD)    Soft tissue mobilization Scar mobilization to bilateral abdominal incisions greatest laterally    Myofascial Release to areas of scar tissue    Manual Lymphatic Drainage (MLD) Manual lymph drainage;short neck, 5 diaphragmatic breaths, activated right axillary LN and left Inguinal LN, anterior interaxillary anasomosis, left axillo-inguinal pathway and left lateral trunk/breast in supine and sidelying.  Pt instructed in proper stretch and practiced Anterior interaxillary and axillo-inguinal pathways    Passive ROM PROM left shoulder flex, scaption, abd, ER in supine to restore ROM                  PT Education - 11/03/19 1216    Education Details Reviewed verbally MLD techniques and practiced proper stretch on Anterior interaxillary and axillo inguinal pathways    Comprehension Returned demonstration;Need further instruction               PT Long Term Goals - 10/27/19 1238      PT LONG TERM GOAL #1   Title Patient will  demonstrate she has regained full shoulder ROM and function post opreatively compared to baselines. (flexion: 150 and abduction 156)    Baseline on 10/27/19: Flexion 142, Abduction: 124    Time 6    Period Weeks    Status New      PT LONG TERM GOAL #2   Title Pt will decrease QDASH to baseline of 2%    Baseline 27%    Time 6    Period Weeks    Status New      PT LONG TERM GOAL #3   Title Pt will demonstrate ability to perform continued abdominal incision work    Time 6    Period Weeks    Status New      PT LONG TERM GOAL #4   Title Pt will report no limitations with pulling in the trunk with ADLs    Time 6    Period Weeks    Status New      PT LONG TERM GOAL #5   Title Pt will be ind with HEP or gym independence    Time 6    Period Weeks    Status New                  Plan - 11/03/19 1220    Clinical Impression Statement Pt. is 6 weeks post DIEP flap/mastectomy and continues with some scar adhesions, decreased ROM, and axillary discomfort.  She had good tolerance for scar mobilization, and MLD, but required VC's to relax for PROM.  AROM for left shoulder is limited and with some scapular hitching.    Personal Factors and Comorbidities Comorbidity 1    Examination-Activity Limitations Lift;Carry    Stability/Clinical Decision Making Stable/Uncomplicated    Rehab Potential Excellent    PT Duration 6 weeks    PT Treatment/Interventions ADLs/Self Care Home Management;Therapeutic exercise;Patient/family education;Manual lymph drainage;Manual techniques;Scar mobilization;Taping    PT Next Visit Plan left shoulder AAROM, PROM, abdominal scar work as tolerated, MLD for the lateral breast*No crunches/intense work focus until 4-6 months post op per Psychologist, sport and exercise*, instruct self MLD    PT Home Exercise Plan Post op shoulder ROM HEP, supine AA flexion    Consulted and Agree with Plan of Care Patient           Patient will benefit from skilled therapeutic intervention in order to improve the following deficits and impairments:  Postural dysfunction, Decreased range of motion, Decreased knowledge of precautions, Impaired UE functional use, Pain, Decreased strength, Increased fascial restricitons  Visit Diagnosis: Abnormal posture  Stiffness of left shoulder, not elsewhere classified  Aftercare following surgery for neoplasm  Ductal carcinoma in situ (DCIS) of left breast     Problem List Patient Active Problem List   Diagnosis Date Noted  . Genetic testing 08/26/2019  . Family history of prostate cancer 08/19/2019  . 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 Family history of ovarian cancer 12/16/2018  . Pelvic pain 12/16/2018  . Scoliosis 03/06/2018  . Chronic constipation 03/06/2018  . Female cystocele 03/06/2018  . Annual physical exam  03/06/2018    Claris Pong, PT 11/03/2019, 12:25 PM  [place], [place], 04/16/1980 Phone: [phone]   Fax:  [phone]  Name: Bethany Weaver MRN: [id number] Date of Birth: [birth year]

## 2019-11-05 ENCOUNTER — Ambulatory Visit: Payer: 59 | Admitting: Rehabilitation

## 2019-11-10 ENCOUNTER — Ambulatory Visit: Payer: 59 | Attending: Surgery | Admitting: Rehabilitation

## 2019-11-10 ENCOUNTER — Encounter: Payer: Self-pay | Admitting: Rehabilitation

## 2019-11-10 ENCOUNTER — Other Ambulatory Visit: Payer: Self-pay

## 2019-11-10 DIAGNOSIS — R293 Abnormal posture: Secondary | ICD-10-CM | POA: Insufficient documentation

## 2019-11-10 DIAGNOSIS — M25612 Stiffness of left shoulder, not elsewhere classified: Secondary | ICD-10-CM | POA: Diagnosis present

## 2019-11-10 DIAGNOSIS — D0512 Intraductal carcinoma in situ of left breast: Secondary | ICD-10-CM | POA: Diagnosis present

## 2019-11-10 DIAGNOSIS — Z483 Aftercare following surgery for neoplasm: Secondary | ICD-10-CM | POA: Diagnosis present

## 2019-11-10 NOTE — Patient Instructions (Signed)
Access Code: [email or web address]: [email or web address]: 11/02/2021Prepared by: Mahlon Gammon Notes No resistance if not cleared by your surgeon yet Exercises  Seated March - 1 x daily - 7 x weekly - 5-10 reps  Seated Shoulder Flexion Full Range Single Arm - 1 x daily - 7 x weekly - 5-10 reps  Seated Shoulder External Rotation - 1 x daily - 7 x weekly - 5-10 reps  Seated Shoulder W - 1 x daily - 7 x weekly - 5-10 reps  Seated Heel Slide - 1 x daily - 7 x weekly - 5-10 reps  Seated Shoulder Abduction with Dumbbells - Thumbs Up - 1 x daily - 7 x weekly - 5-10 reps  Seated Shoulder Row with Resistance Anchored at Feet - 1 x daily - 7 x weekly - 5-10 reps

## 2019-11-10 NOTE — Therapy (Signed)
[place], [place], [address] Phone: [phone]   Fax:  [phone]  Physical Therapy Treatment  Patient Details  Name: Bethany Weaver MRN: [id number] Date of Birth: [birth year] Referring Provider (PT): Coral Ceo PA-C    Encounter Date: 11/10/2019   PT End of Session - 11/10/19 1159    Visit Number 4    Number of Visits 14    Date for PT Re-Evaluation 12/08/19    PT Start Time 1105    PT Stop Time 1155    PT Time Calculation (min) 50 min    Activity Tolerance Patient tolerated treatment well    Behavior During Therapy [facility] for tasks assessed/performed           Past Medical History:  Diagnosis Date   Cyst, ovarian    SVT (supraventricular tachycardia) ([place])     History reviewed. No pertinent surgical history.  There were no vitals filed for this visit.   Subjective Assessment - 11/10/19 1106    Subjective For some reason it has been hurting more starting Sunday in the under part of the breast.  No changes noticed    Pertinent History Left mastectomy and immediate DIEP flap on 09/20/19 by Dr. Dorann Ou at [facility] due to left breast cancer with removal of 6 lymph nodes all negative.  No radiation needed and no chemotherapy.    Patient Stated Goals get back to normal    Currently in Pain? No/denies                             [facility] Adult PT Treatment/Exercise - 11/10/19 0001      Exercises   Exercises Shoulder      Shoulder Exercises: Seated   Elevation Both;5 reps    Retraction Both;5 reps    External Rotation Both;5 reps    Flexion Both;5 reps    Abduction Both;5 reps    ABduction Limitations focus on no pinch in the shoulder and scapular activation    Other Seated Exercises seated W with scapular retraction focus     Other Seated Exercises core activation with slow march x 5 each and core activation with alternating heel slide x 5 each      Shoulder  Exercises: Pulleys   Flexion 2 minutes    Flexion Limitations with vcs for initial completion    ABduction 2 minutes    ABduction Limitations with vcs for intial performance       Manual Therapy   Myofascial Release briefly to the lateral abdominal incision    Manual Lymphatic Drainage (MLD) Manual lymph drainage;left axillary and  Inguinal LN, left axillo-inguinal pathway and left lateral trunk/breast in supine noted some fibrosis feeling inferior breast so this was included as well today    Passive ROM PROM left shoulder flex, scaption, abd, ER in supine to restore ROM                       PT Long Term Goals - 10/27/19 1238      PT LONG TERM GOAL #1   Title Patient will demonstrate she has regained full shoulder ROM and function post opreatively compared to baselines. (flexion: 150 and abduction 156)    Baseline on 10/27/19: Flexion 142, Abduction: 124    Time 6    Period Weeks    Status New      PT LONG TERM GOAL #2  Title Pt will decrease QDASH to baseline of 2%    Baseline 27%    Time 6    Period Weeks    Status New      PT LONG TERM GOAL #3   Title Pt will demonstrate ability to perform continued abdominal incision work    Time 6    Period Weeks    Status New      PT LONG TERM GOAL #4   Title Pt will report no limitations with pulling in the trunk with ADLs    Time 6    Period Weeks    Status New      PT LONG TERM GOAL #5   Title Pt will be ind with HEP or gym independence    Time 6    Period Weeks    Status New                 Plan - 11/10/19 1159    Clinical Impression Statement Pt now 7 weeks post DIEP.  Improving AROM but with more complaints of impingement type shoulder pain vs axillary pain.  Added some seated pilates type movements with arm/scapular activation and gentle core activation focus.  Pt did report some new inferior breast pain with new edema fibrosis noted.  Added some MLD to this area and reminded pt that compression  bras are available which she is still interested in pursuing.  Pt experienced the death of her father this past weekend and is struggling with this.    PT Duration 6 weeks    PT Treatment/Interventions ADLs/Self Care Home Management;Therapeutic exercise;Patient/family education;Manual lymph drainage;Manual techniques;Scar mobilization;Taping    PT Next Visit Plan left shoulder AAROM, PROM, abdominal scar work as tolerated, MLD for the lateral breast*No crunches/intense work focus until 4-6 months post op per Psychologist, sport and exercise*, instruct self MLD    PT Home Exercise Plan Post op shoulder ROM HEP, supine AA flexion    Consulted and Agree with Plan of Care Patient           Patient will benefit from skilled therapeutic intervention in order to improve the following deficits and impairments:     Visit Diagnosis: Abnormal posture  Stiffness of left shoulder, not elsewhere classified  Aftercare following surgery for neoplasm  Ductal carcinoma in situ (DCIS) of left breast     Problem List Patient Active Problem List   Diagnosis Date Noted   Genetic testing 08/26/2019   Family history of prostate cancer 08/19/2019   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, [place], [address] Phone: [phone]   Fax:  [phone]  Name: Bethany Weaver MRN: [id number] Date of Birth: Nov 06, 1980

## 2019-11-12 ENCOUNTER — Encounter: Payer: Self-pay | Admitting: Rehabilitation

## 2019-11-12 ENCOUNTER — Ambulatory Visit: Payer: 59 | Admitting: Rehabilitation

## 2019-11-12 ENCOUNTER — Other Ambulatory Visit: Payer: Self-pay

## 2019-11-12 DIAGNOSIS — R293 Abnormal posture: Secondary | ICD-10-CM | POA: Diagnosis not present

## 2019-11-12 DIAGNOSIS — D0512 Intraductal carcinoma in situ of left breast: Secondary | ICD-10-CM

## 2019-11-12 DIAGNOSIS — M25612 Stiffness of left shoulder, not elsewhere classified: Secondary | ICD-10-CM

## 2019-11-12 DIAGNOSIS — Z483 Aftercare following surgery for neoplasm: Secondary | ICD-10-CM

## 2019-11-12 NOTE — Therapy (Signed)
[place], [place], [address] Phone: [phone]   Fax:  [phone]  Physical Therapy Treatment  Patient Details  Name: Bethany Weaver MRN: [id number] Date of Birth: [birth year] Referring Provider (PT): Coral Ceo PA-C    Encounter Date: 11/12/2019   PT End of Session - 11/12/19 1054    Visit Number 5    Number of Visits 14    Date for PT Re-Evaluation 12/08/19    PT Start Time 0905    PT Stop Time 0955    PT Time Calculation (min) 50 min    Activity Tolerance Patient tolerated treatment well    Behavior During Therapy [facility] for tasks assessed/performed           Past Medical History:  Diagnosis Date  . Cyst, ovarian   . SVT (supraventricular tachycardia) ([place])     History reviewed. No pertinent surgical history.  There were no vitals filed for this visit.   Subjective Assessment - 11/12/19 0905    Subjective I keep having the random pain underneath the breast maybe with sudden movements    Pertinent History Left mastectomy and immediate DIEP flap on 09/20/19 by Dr. Dorann Ou at [facility] due to left breast cancer with removal of 6 lymph nodes all negative.  No radiation needed and no chemotherapy.    Currently in Pain? No/denies                             [facility] Adult PT Treatment/Exercise - 11/12/19 0001      Shoulder Exercises: Seated   Retraction 10 reps      Shoulder Exercises: Standing   Protraction Both;10 reps    Protraction Limitations hands on wall       Shoulder Exercises: Pulleys   Flexion 2 minutes    Flexion Limitations with vcs for initial completion    ABduction 2 minutes    ABduction Limitations with vcs for intial performance       Shoulder Exercises: Therapy Ball   Flexion Both;5 reps      Shoulder Exercises: Stretch   Corner Stretch Limitations doorway gentle 2x15"       Manual Therapy   Myofascial Release briefly to the lateral  abdominal incision using cocoa butter    Manual Lymphatic Drainage (MLD) Manual lymph drainage;left axillary and  Inguinal LN, left axillo-inguinal pathway and left lateral trunk/breast in supine noted some fibrosis feeling inferior breast so this was included as well today    Passive ROM PROM left shoulder flex, scaption, abd, ER in supine to restore ROM                       PT Long Term Goals - 10/27/19 1238      PT LONG TERM GOAL #1   Title Patient will demonstrate she has regained full shoulder ROM and function post opreatively compared to baselines. (flexion: 150 and abduction 156)    Baseline on 10/27/19: Flexion 142, Abduction: 124    Time 6    Period Weeks    Status New      PT LONG TERM GOAL #2   Title Pt will decrease QDASH to baseline of 2%    Baseline 27%    Time 6    Period Weeks    Status New      PT LONG TERM GOAL #3   Title Pt will demonstrate ability to  perform continued abdominal incision work    Time 6    Period Weeks    Status New      PT LONG TERM GOAL #4   Title Pt will report no limitations with pulling in the trunk with ADLs    Time 6    Period Weeks    Status New      PT LONG TERM GOAL #5   Title Pt will be ind with HEP or gym independence    Time 6    Period Weeks    Status New                 Plan - 11/12/19 1054    Clinical Impression Statement Continued POC today with MLD, scar tissue work, PROM, and included some new AROM stretches which were tolerated well.  Pt continues with inferior fibrosis in the breast vs hardness from surgery? Should be ready for resistance next visit    PT Duration 6 weeks    PT Treatment/Interventions ADLs/Self Care Home Management;Therapeutic exercise;Patient/family education;Manual lymph drainage;Manual techniques;Scar mobilization;Taping    PT Next Visit Plan tband? left shoulder AAROM, PROM, abdominal scar work as tolerated, MLD for the lateral breast*No crunches/intense work focus until 4-6  months post op per Psychologist, sport and exercise*, instruct self MLD    PT Home Exercise Plan Post op shoulder ROM HEP, supine AA flexion, seated pilates from 11/10/19    Consulted and Agree with Plan of Care Patient           Patient will benefit from skilled therapeutic intervention in order to improve the following deficits and impairments:     Visit Diagnosis: Abnormal posture  Stiffness of left shoulder, not elsewhere classified  Aftercare following surgery for neoplasm  Ductal carcinoma in situ (DCIS) of left breast     Problem List Patient Active Problem List   Diagnosis Date Noted  . Genetic testing 08/26/2019  . Family history of prostate cancer 08/19/2019  . 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 Family history of ovarian cancer 12/16/2018  . Pelvic pain 12/16/2018  . Scoliosis 03/06/2018  . Chronic constipation 03/06/2018  . Female cystocele 03/06/2018  . Annual physical exam 03/06/2018    Stark Bray 11/12/2019, 10:56 AM  [place], [place], [address] Phone: [phone]   Fax:  [phone]  Name: Bethany Weaver MRN: [id number] Date of Birth: 25-Mar-1980

## 2019-11-17 ENCOUNTER — Ambulatory Visit: Payer: 59

## 2019-11-19 ENCOUNTER — Encounter: Payer: 59 | Admitting: Rehabilitation

## 2019-11-20 ENCOUNTER — Ambulatory Visit: Payer: 59 | Admitting: Rehabilitation

## 2019-11-20 ENCOUNTER — Encounter: Payer: Self-pay | Admitting: Rehabilitation

## 2019-11-20 ENCOUNTER — Other Ambulatory Visit: Payer: Self-pay

## 2019-11-20 DIAGNOSIS — R293 Abnormal posture: Secondary | ICD-10-CM | POA: Diagnosis not present

## 2019-11-20 DIAGNOSIS — Z483 Aftercare following surgery for neoplasm: Secondary | ICD-10-CM

## 2019-11-20 DIAGNOSIS — M25612 Stiffness of left shoulder, not elsewhere classified: Secondary | ICD-10-CM

## 2019-11-20 DIAGNOSIS — D0512 Intraductal carcinoma in situ of left breast: Secondary | ICD-10-CM

## 2019-11-20 NOTE — Therapy (Signed)
[place], [place], [address] Phone: [phone]   Fax:  [phone]  Physical Therapy Treatment  Patient Details  Name: Bethany Weaver MRN: [id number] Date of Birth: Oct 02, 1980 Referring Provider (PT): Coral Ceo PA-C    Encounter Date: 11/20/2019   PT End of Session - 11/20/19 1129    Visit Number 6    Number of Visits 14    Date for PT Re-Evaluation 12/08/19    PT Start Time 0806    PT Stop Time 0856    PT Time Calculation (min) 50 min    Activity Tolerance Patient tolerated treatment well    Behavior During Therapy [facility] for tasks assessed/performed           Past Medical History:  Diagnosis Date  . Cyst, ovarian   . SVT (supraventricular tachycardia) ([place])     History reviewed. No pertinent surgical history.  There were no vitals filed for this visit.   Subjective Assessment - 11/20/19 0809    Subjective The pain seems better    Pertinent History Left mastectomy and immediate DIEP flap on 09/20/19 by Dr. Dorann Ou at [facility] due to left breast cancer with removal of 6 lymph nodes all negative.  No radiation needed and no chemotherapy.    Currently in Pain? No/denies                             [facility] Adult PT Treatment/Exercise - 11/20/19 0001      Shoulder Exercises: Supine   Horizontal ABduction Both;10 reps    Theraband Level (Shoulder Horizontal ABduction) Level 1 (Yellow)    External Rotation Both;10 reps    Theraband Level (Shoulder External Rotation) Level 1 (Yellow)    Diagonals Both;10 reps    Theraband Level (Shoulder Diagonals) Level 1 (Yellow)      Shoulder Exercises: Standing   Row Both;15 reps    Theraband Level (Shoulder Row) Level 1 (Yellow)      Shoulder Exercises: Pulleys   Flexion 2 minutes    ABduction 2 minutes      Shoulder Exercises: Therapy Ball   Flexion Both;5 reps    ABduction Left;5 reps      Manual Therapy   Manual  Lymphatic Drainage (MLD) Manual lymph drainage;left axillary and  Inguinal LN, left axillo-inguinal pathway and left lateral trunk/breast in supine noted some fibrosis feeling inferior breast so this was included as well today    Passive ROM PROM left shoulder flex, scaption, abd, ER in supine to restore ROM                       PT Long Term Goals - 10/27/19 1238      PT LONG TERM GOAL #1   Title Patient will demonstrate she has regained full shoulder ROM and function post opreatively compared to baselines. (flexion: 150 and abduction 156)    Baseline on 10/27/19: Flexion 142, Abduction: 124    Time 6    Period Weeks    Status New      PT LONG TERM GOAL #2   Title Pt will decrease QDASH to baseline of 2%    Baseline 27%    Time 6    Period Weeks    Status New      PT LONG TERM GOAL #3   Title Pt will demonstrate ability to perform continued abdominal incision work  Time 6    Period Weeks    Status New      PT LONG TERM GOAL #4   Title Pt will report no limitations with pulling in the trunk with ADLs    Time 6    Period Weeks    Status New      PT LONG TERM GOAL #5   Title Pt will be ind with HEP or gym independence    Time 6    Period Weeks    Status New                 Plan - 11/20/19 1129    Clinical Impression Statement Pt is demonstrated excellent AROM without feeling much difference between sides.  Advanced TE to include supine scapular series with the yellow theraband which was performed well with no pain.  Pt was given yellow and red bands for home and education on how to progress.  Breast overall is softer with less inferior fibrosis.  Decreased to 1x next week due to excellent status    PT Duration 6 weeks    PT Treatment/Interventions ADLs/Self Care Home Management;Therapeutic exercise;Patient/family education;Manual lymph drainage;Manual techniques;Scar mobilization;Taping    PT Next Visit Plan scar work as tolerated, MLD for the lateral  breast/inf breast, progress general Upper arm strengthening, *No crunches/intense work focus until 4-6 months post op per Psychologist, sport and exercise*, instruct self MLD    PT Home Exercise Plan updated to supine scap, wall flexion and abduction, row    Consulted and Agree with Plan of Care Patient           Patient will benefit from skilled therapeutic intervention in order to improve the following deficits and impairments:     Visit Diagnosis: Abnormal posture  Stiffness of left shoulder, not elsewhere classified  Aftercare following surgery for neoplasm  Ductal carcinoma in situ (DCIS) of left breast     Problem List Patient Active Problem List   Diagnosis Date Noted  . Genetic testing 08/26/2019  . Family history of prostate cancer 08/19/2019  . 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 Family history of ovarian cancer 12/16/2018  . Pelvic pain 12/16/2018  . Scoliosis 03/06/2018  . Chronic constipation 03/06/2018  . Female cystocele 03/06/2018  . Annual physical exam 03/06/2018    Stark Bray 11/20/2019, 11:32 AM  [place], [place], [address] Phone: [phone]   Fax:  [phone]  Name: Angeni Chaudhuri MRN: [id number] Date of Birth: 10-19-1980

## 2019-11-20 NOTE — Patient Instructions (Signed)
Access Code: [email or web address]: [email or web address]: 11/12/2021Prepared by: Marcene Brawn TevisExercises  Supine Shoulder Horizontal Abduction with Resistance - 1 x daily - 3-4 x weekly - 1-3 sets - 10 reps  Supine Shoulder External Rotation with Resistance - 1 x daily - 3-4 x weekly - 1-3 sets - 10 reps  Supine PNF D2 Flexion with Resistance - 1 x daily - 3-4 x weekly - 1-3 sets - 10 reps  Standing Row with Anchored Resistance - 1 x daily - 3-4 x weekly - 1-3 sets - 10 reps - 2-3 second hold  Standing shoulder flexion wall slides - 1 x daily - 7 x weekly - 1-3 sets - 10 reps - 20-30 seconds hold  Standing Shoulder Abduction Slides at Wall - 1 x daily - 7 x weekly - 1-3 sets - 10 reps - 20-30 seconds hold

## 2019-11-24 ENCOUNTER — Encounter: Payer: 59 | Admitting: Rehabilitation

## 2019-11-26 ENCOUNTER — Ambulatory Visit: Payer: 59 | Admitting: Rehabilitation

## 2019-11-26 ENCOUNTER — Encounter: Payer: Self-pay | Admitting: Rehabilitation

## 2019-12-08 NOTE — Assessment & Plan Note (Signed)
08/17/2019:Screening mammogram showed 2 right breast masses and microcalcifications in the left breast. Diagnostic mammogram showed calcifications in the left breast spanning 4.9cm, and two adjacent cysts in the right breast. Left breast biopsy showed DCIS, high grade, ER+ 100%, PR+ 50%.  09/21/2019: Left mastectomy with D IEP flap reconstruction (at [facility]): High-grade DCIS 3.5 cm ER 100%, PR 100%  Treatment plan: Adjuvant tamoxifen x5 years We decided to start her 10 mg daily starting 11/09/2019. If she tolerates it very well she can go up to 20 mg.  If she does not tolerate it then we will drop it to 5 mg.  Tamoxifen Toxicities:

## 2019-12-08 NOTE — Progress Notes (Signed)
HEMATOLOGY-ONCOLOGY MYCHART VIDEO VISIT PROGRESS NOTE  I connected with Bethany Weaver on 12/09/2019 at  1:45 PM EST by MyChart video conference and verified that I am speaking with the correct person using two identifiers.  I discussed the limitations, risks, security and privacy concerns of performing an evaluation and management service by MyChart and the availability of in person appointments.  I also discussed with the patient that there may be a patient responsible charge related to this service. The patient expressed understanding and agreed to proceed.  Patient's Location: Home Physician Location: Clinic  CHIEF COMPLIANT: Follow-up of left breast DCIS on tamoxifen  INTERVAL HISTORY: Bethany Weaver is a 39 y.o. female with above-mentioned history of left breast DCIS who underwent a left mastectomy and is currently on antiestrogen therapy with tamoxifen. She presents over MyChart today for follow-up.   Oncology History  Ductal carcinoma in situ (DCIS) of left breast  08/17/2019 Initial Diagnosis   Screening mammogram showed 2 right breast masses and microcalcifications in the left breast. Diagnostic mammogram showed calcifications in the left breast spanning 4.9cm, and two adjacent cysts in the right breast. Left breast biopsy showed DCIS, high grade, ER+ 100%, PR+ 50%.   08/26/2019 Genetic Testing   No pathogenic variants detected in [facility] Common Hereditary Cancers Panel.  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 The Common Hereditary Cancers Panel offered by [facility] includes sequencing and/or deletion duplication testing of the following 48 genes: APC, ATM, AXIN2, BARD1, BMPR1A, BRCA1, BRCA2, BRIP1, CDH1, CDK4, CDKN2A (p14ARF), CDKN2A (p16INK4a), CHEK2, CTNNA1, DICER1, EPCAM (Deletion/duplication testing only), GREM1 (promoter region deletion/duplication testing only), KIT, MEN1, MLH1, MSH2, MSH3, MSH6, MUTYH, NBN, NF1, NHTL1,  PALB2, PDGFRA, PMS2, POLD1, POLE, PTEN, RAD50, RAD51C, RAD51D, RNF43, SDHB, SDHC, SDHD, SMAD4, SMARCA4. STK11, TP53, TSC1, TSC2, and VHL.  The following genes were evaluated for sequence changes only: SDHA and HOXB13 c.251G>A variant only.  The report date is August 26, 2019.    09/21/2019 Surgery   Left mastectomy with reconstruction ([facility]): high grade DCIS, 3.5cm, no invasive carcinoma, clear margins, 6 left axillary lymph nodes negative for carcinoma.    10/2019 -  Anti-estrogen oral therapy   Tamoxifen, $RemoveBef'10mg'[id number]$  daily     Observations/Objective:  There were no vitals filed for this visit. There is no height or weight on file to calculate BMI.  I have reviewed the data as listed CMP Latest Ref Rng & Units 08/19/2019 12/10/2018  Glucose 70 - 99 mg/dL 96 81  BUN 6 - 20 mg/dL 7 11  Creatinine 0.44 - 1.00 mg/dL 0.80 0.81  Sodium 135 - 145 mmol/L 140 139  Potassium 3.5 - 5.1 mmol/L 4.2 3.5  Chloride 98 - 111 mmol/L 107 103  CO2 22 - 32 mmol/L 27 28  Calcium 8.9 - 10.3 mg/dL 8.9 8.9  Total Protein 6.5 - 8.1 g/dL 7.1 7.4  Total Bilirubin 0.3 - 1.2 mg/dL 0.3 0.4  Alkaline Phos 38 - 126 U/L 39 44  AST 15 - 41 U/L 11(L) 16  ALT 0 - 44 U/L 12 23    Lab Results  Component Value Date   WBC 6.5 08/19/2019   HGB 12.0 08/19/2019   HCT 37.0 08/19/2019   MCV 87.7 08/19/2019   PLT 310 08/19/2019   NEUTROABS 4.0 08/19/2019      Assessment Plan:  Ductal carcinoma in situ (DCIS) of left breast 08/17/2019:Screening mammogram showed 2 right breast masses and microcalcifications in the left breast. Diagnostic mammogram  showed calcifications in the left breast spanning 4.9cm, and two adjacent cysts in the right breast. Left breast biopsy showed DCIS, high grade, ER+ 100%, PR+ 50%.  09/21/2019: Left mastectomy with D IEP flap reconstruction (at [facility]): High-grade DCIS 3.5 cm ER 100%, PR 100%  Treatment plan: Adjuvant tamoxifen x5 years We decided to start her 10 mg daily started 11/24/2019.     Tamoxifen Toxicities: Feeling warm  Her dad passed away Nov 16, 2019 She has been very emotional since this is happened.  We talked about increasing the dosage to 20 mg daily after she completes the 10 mg dosage. I will see her back in 6 months for follow-up.  After that we can see her once a year.   I discussed the assessment and treatment plan with the patient. The patient was provided an opportunity to ask questions and all were answered. The patient agreed with the plan and demonstrated an understanding of the instructions. The patient was advised to call back or seek an in-person evaluation if the symptoms worsen or if the condition fails to improve as anticipated.   I provided 20 minutes of face-to-face MyChart video visit time during this encounter.    Rulon Eisenmenger, MD 12/09/2019   I, Molly Dorshimer, am acting as scribe for Nicholas Lose, MD.  I have reviewed the above documentation for accuracy and completeness, and I agree with the above.

## 2019-12-09 ENCOUNTER — Inpatient Hospital Stay: Payer: 59 | Attending: Hematology and Oncology | Admitting: Hematology and Oncology

## 2019-12-09 DIAGNOSIS — Z170 Estrogen receptor positive status [ER+]: Secondary | ICD-10-CM | POA: Insufficient documentation

## 2019-12-09 DIAGNOSIS — D0512 Intraductal carcinoma in situ of left breast: Secondary | ICD-10-CM | POA: Diagnosis not present

## 2019-12-09 DIAGNOSIS — Z9012 Acquired absence of left breast and nipple: Secondary | ICD-10-CM | POA: Diagnosis not present

## 2019-12-09 DIAGNOSIS — N6001 Solitary cyst of right breast: Secondary | ICD-10-CM | POA: Insufficient documentation

## 2019-12-09 DIAGNOSIS — Z79810 Long term (current) use of selective estrogen receptor modulators (SERMs): Secondary | ICD-10-CM | POA: Insufficient documentation

## 2019-12-09 MED ORDER — TAMOXIFEN CITRATE 20 MG PO TABS: 20.0000 mg | ORAL_TABLET | Freq: Every day | ORAL | 3 refills | Status: DC

## 2020-01-20 ENCOUNTER — Encounter: Payer: Self-pay | Admitting: Hematology and Oncology

## 2020-01-29 ENCOUNTER — Encounter: Payer: Self-pay | Admitting: Hematology and Oncology

## 2020-03-18 ENCOUNTER — Encounter: Payer: Self-pay | Admitting: Hematology and Oncology

## 2020-03-22 ENCOUNTER — Telehealth: Payer: Self-pay | Admitting: Hematology and Oncology

## 2020-03-22 NOTE — Telephone Encounter (Signed)
Scheduled appt per 3/15 sch msg. Pt aware.

## 2020-03-28 NOTE — Progress Notes (Signed)
HEMATOLOGY-ONCOLOGY MYCHART VIDEO VISIT PROGRESS NOTE  I connected with Bethany Weaver on 03/29/2020 at  8:45 AM EDT by MyChart video conference and verified that I am speaking with the correct person using two identifiers.  I discussed the limitations, risks, security and privacy concerns of performing an evaluation and management service by MyChart and the availability of in person appointments.  I also discussed with the patient that there may be a patient responsible charge related to this service. The patient expressed understanding and agreed to proceed.  Patient's Location: Home Physician Location: Clinic  CHIEF COMPLIANT: Follow-up of left breast DCIS on tamoxifen  INTERVAL HISTORY: Bethany Weaver is a 40 y.o. female with above-mentioned history of left breast DCIS who underwent a left mastectomy and is currently on antiestrogen therapy with tamoxifen. She presents over MyChart today for follow-up.   She had a sudden onset of severe right shoulder pain this was after a recent trip.  She thought that she may have injured her shoulder by picking up her heavy hand luggage from the overhead bin.  The pain became so bad that she went to orthopedics.  They injected her with steroids and subsequently the pain got significantly better.  She is almost back to normal.  She stopped tamoxifen briefly during this episode.  Oncology History  Ductal carcinoma in situ (DCIS) of left breast  08/17/2019 Initial Diagnosis   Screening mammogram showed 2 right breast masses and microcalcifications in the left breast. Diagnostic mammogram showed calcifications in the left breast spanning 4.9cm, and two adjacent cysts in the right breast. Left breast biopsy showed DCIS, high grade, ER+ 100%, PR+ 50%.   08/26/2019 Genetic Testing   No pathogenic variants detected in [facility] Common Hereditary Cancers Panel.  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 The  Common Hereditary Cancers Panel offered by [facility] includes sequencing and/or deletion duplication testing of the following 48 genes: APC, ATM, AXIN2, BARD1, BMPR1A, BRCA1, BRCA2, BRIP1, CDH1, CDK4, CDKN2A (p14ARF), CDKN2A (p16INK4a), CHEK2, CTNNA1, DICER1, EPCAM (Deletion/duplication testing only), GREM1 (promoter region deletion/duplication testing only), KIT, MEN1, MLH1, MSH2, MSH3, MSH6, MUTYH, NBN, NF1, NHTL1, PALB2, PDGFRA, PMS2, POLD1, POLE, PTEN, RAD50, RAD51C, RAD51D, RNF43, SDHB, SDHC, SDHD, SMAD4, SMARCA4. STK11, TP53, TSC1, TSC2, and VHL.  The following genes were evaluated for sequence changes only: SDHA and HOXB13 c.251G>A variant only.  The report date is August 26, 2019.    09/21/2019 Surgery   Left mastectomy with reconstruction ([facility] @ [facility]): high grade DCIS, 3.5cm, no invasive carcinoma, clear margins, 6 left axillary lymph nodes negative for carcinoma.    10/2019 -  Anti-estrogen oral therapy   Tamoxifen, 23m daily     Observations/Objective:  There were no vitals filed for this visit. There is no height or weight on file to calculate BMI.  I have reviewed the data as listed CMP Latest Ref Rng & Units 08/19/2019 12/10/2018  Glucose 70 - 99 mg/dL 96 81  BUN 6 - 20 mg/dL 7 11  Creatinine 0.44 - 1.00 mg/dL 0.80 0.81  Sodium 135 - 145 mmol/L 140 139  Potassium 3.5 - 5.1 mmol/L 4.2 3.5  Chloride 98 - 111 mmol/L 107 103  CO2 22 - 32 mmol/L 27 28  Calcium 8.9 - 10.3 mg/dL 8.9 8.9  Total Protein 6.5 - 8.1 g/dL 7.1 7.4  Total Bilirubin 0.3 - 1.2 mg/dL 0.3 0.4  Alkaline Phos 38 - 126 U/L 39 44  AST 15 - 41 U/L 11(L) 16  ALT 0 - 44 U/L 12 23    Lab Results  Component Value Date   WBC 6.5 08/19/2019   HGB 12.0 08/19/2019   HCT 37.0 08/19/2019   MCV 87.7 08/19/2019   PLT 310 08/19/2019   NEUTROABS 4.0 08/19/2019      Assessment Plan:  Ductal carcinoma in situ (DCIS) of left breast 08/17/2019:Screening mammogram showed 2 right breast masses and microcalcifications in the  left breast. Diagnostic mammogram showed calcifications in the left breast spanning 4.9cm, and two adjacent cysts in the right breast. Left breast biopsy showed DCIS, high grade, ER+ 100%, PR+ 50%.  09/21/2019: Left mastectomy with DIEP flap reconstruction (at [facility]): High-grade DCIS 3.5 cm ER 100%, PR 100%  Treatment plan: Adjuvant tamoxifen x5 years We decided to start her 10 mg daily started 11/24/2019 increase to 20 mg 12/09/2019.  Decreased dosage to 10 mg daily 03/29/2020    Tamoxifen Toxicities: 1. Feeling warm 2. Shoulder pain March 2022: started 2 weeks go quite severe. Saw Ortho urgent care: Steroid inj made it better. Oral steroid taper. Meloxicam was also given. 3. Heavier menstrual cycles: Recommended to stay at 10 mg  Mammogram will need to be obtained in July 2022 on the right breast  Return to clinic in 1 year for follow-up    I discussed the assessment and treatment plan with the patient. The patient was provided an opportunity to ask questions and all were answered. The patient agreed with the plan and demonstrated an understanding of the instructions. The patient was advised to call back or seek an in-person evaluation if the symptoms worsen or if the condition fails to improve as anticipated.   Total time spent: 12 minutes including face-to-face MyChart video visit time and time spent for planning, charting and coordination of care  Rulon Eisenmenger, MD 03/29/2020   I, Cloyde Reams Dorshimer, am acting as scribe for Nicholas Lose, MD.  I have reviewed the above documentation for accuracy and completeness, and I agree with the above.

## 2020-03-29 ENCOUNTER — Other Ambulatory Visit: Payer: Self-pay | Admitting: *Deleted

## 2020-03-29 ENCOUNTER — Inpatient Hospital Stay: Payer: 59 | Attending: Hematology and Oncology | Admitting: Hematology and Oncology

## 2020-03-29 ENCOUNTER — Encounter: Payer: Self-pay | Admitting: Hematology and Oncology

## 2020-03-29 DIAGNOSIS — D0512 Intraductal carcinoma in situ of left breast: Secondary | ICD-10-CM | POA: Insufficient documentation

## 2020-03-29 DIAGNOSIS — Z9012 Acquired absence of left breast and nipple: Secondary | ICD-10-CM | POA: Insufficient documentation

## 2020-03-29 DIAGNOSIS — Z79810 Long term (current) use of selective estrogen receptor modulators (SERMs): Secondary | ICD-10-CM | POA: Insufficient documentation

## 2020-03-29 DIAGNOSIS — M25511 Pain in right shoulder: Secondary | ICD-10-CM | POA: Diagnosis not present

## 2020-03-29 DIAGNOSIS — N6002 Solitary cyst of left breast: Secondary | ICD-10-CM | POA: Insufficient documentation

## 2020-03-29 DIAGNOSIS — N6001 Solitary cyst of right breast: Secondary | ICD-10-CM | POA: Insufficient documentation

## 2020-03-29 DIAGNOSIS — Z79899 Other long term (current) drug therapy: Secondary | ICD-10-CM | POA: Diagnosis not present

## 2020-03-29 MED ORDER — TAMOXIFEN CITRATE 10 MG PO TABS: 10.0000 mg | ORAL_TABLET | Freq: Every day | ORAL | 3 refills | Status: AC

## 2020-03-29 NOTE — Assessment & Plan Note (Signed)
08/17/2019:Screening mammogram showed 2 right breast masses and microcalcifications in the left breast. Diagnostic mammogram showed calcifications in the left breast spanning 4.9cm, and two adjacent cysts in the right breast. Left breast biopsy showed DCIS, high grade, ER+ 100%, PR+ 50%.  09/21/2019: Left mastectomy with DIEP flap reconstruction (at [facility]): High-grade DCIS 3.5 cm ER 100%, PR 100%  Treatment plan: Adjuvant tamoxifen x5 years We decided to start her 10 mg daily started 11/24/2019 increase to 20 mg 12/09/2019.    Tamoxifen Toxicities: Feeling warm  Mammogram will need to be obtained in July 2022  Return to clinic in 1 year for follow-up

## 2020-06-09 ENCOUNTER — Telehealth: Payer: Self-pay | Admitting: *Deleted

## 2020-06-09 ENCOUNTER — Inpatient Hospital Stay: Payer: 59 | Attending: Hematology and Oncology | Admitting: Hematology and Oncology

## 2020-06-09 NOTE — Telephone Encounter (Signed)
RN attempt x1 to contact pt regarding missed apt.  No answer, LVM to call the office to reschedule.

## 2020-06-09 NOTE — Assessment & Plan Note (Deleted)
08/17/2019:Screening mammogram showed 2 right breast masses and microcalcifications in the left breast. Diagnostic mammogram showed calcifications in the left breast spanning 4.9cm, and two adjacent cysts in the right breast. Left breast biopsy showed DCIS, high grade, ER+ 100%, PR+ 50%.  09/21/2019: Left mastectomy with DIEP flap reconstruction (at [facility]): High-grade DCIS 3.5 cm ER 100%, PR 100%  Treatment plan: Adjuvant tamoxifen x5 years We decided to start her 10 mg daily started11/16/2021 increase to 20 mg 12/09/2019.  Decreased dosage to 10 mg daily 03/29/2020  Tamoxifen Toxicities: 1. Feeling warm 2. Shoulder pain March 2022: started 2 weeks go quite severe. Saw Ortho urgent care: Steroid inj made it better. Oral steroid taper. Meloxicam was also given. 3. Heavier menstrual cycles: Recommended to stay at 10 mg  Mammogram will need to be obtained in July 2022 on the right breast  Return to clinic in 1 year for follow-up

## 2020-06-13 ENCOUNTER — Encounter: Payer: Self-pay | Admitting: Hematology and Oncology

## 2021-03-17 ENCOUNTER — Ambulatory Visit: Payer: BC Managed Care – PPO | Admitting: Gastroenterology

## 2021-03-17 ENCOUNTER — Encounter: Payer: Self-pay | Admitting: Gastroenterology

## 2021-03-17 VITALS — BP 112/66 | HR 93 | Ht 63.0 in | Wt 185.0 lb

## 2021-03-17 DIAGNOSIS — K5909 Other constipation: Secondary | ICD-10-CM

## 2021-03-17 DIAGNOSIS — R198 Other specified symptoms and signs involving the digestive system and abdomen: Secondary | ICD-10-CM | POA: Diagnosis not present

## 2021-03-17 DIAGNOSIS — R1319 Other dysphagia: Secondary | ICD-10-CM | POA: Diagnosis not present

## 2021-03-17 DIAGNOSIS — R140 Abdominal distension (gaseous): Secondary | ICD-10-CM | POA: Diagnosis not present

## 2021-03-17 NOTE — Progress Notes (Signed)
[facility] Consult Note:  History: Bethany Weaver 03/17/2021  Referring provider: Patient, No Pcp Per (Inactive)  Reason for consult/chief complaint: Abdominal Pain (RLQ, Onset years,), Bloated (Onset years), Dysphagia (Off and on for years, globus sensation ), and EGD (Dilation 07-2017 - helped)   Subjective  HPI:  Bethany Weaver came to see me for multiple symptoms today. I saw her once in December 2020 for consult related to right upper quadrant pain and bloating with a history of chronic abdominal pain dysphagia and constipation evaluated by a GI practice in [place] (see my office note with addenda for complete details). Normal abdominal ultrasound after that appointment with me.  She has recurrence of solid food dysphagia, or things feel stuck in the neck and she has a sensation of fullness and mucus.  She recalls that the endoscopic dilation done in [facility] July 2019 helped the symptoms significantly until it recurred several months ago.  She has continued to have constipation that has bothered her for many years, she may go up to 5 days without a BM and has chronic bloating and generalized discomfort as a result.  MiraLAX and stool softener have been of limited benefit.   She does not get regurgitation or heartburn, nor has that been a chronic problem in the past. Since her trans flap reconstruction of the left breast (see below) she has felt that her torso and abdominal muscles have changed and that has heightened the bloating sensation.  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 Tamoxifen caused menorrhagia and then iron deficiency anemia, so she has been seeing hematology and was put on iron tablets that have worsened her constipation.  She is due to see the  hematologist soon, and I recommended that she request some IV iron since she is not tolerating the oral iron.  ROS:  Review of Systems  Constitutional:  Negative for appetite change and unexpected weight change.  HENT:  Negative for mouth sores and voice change.   Eyes:  Negative for pain and redness.  Respiratory:  Negative for cough and shortness of breath.   Cardiovascular:  Negative for chest pain and palpitations.  Genitourinary:  Negative for dysuria and hematuria.  Musculoskeletal:  Negative for arthralgias and myalgias.  Skin:  Negative for pallor and rash.  Neurological:  Negative for weakness and headaches.  Hematological:  Negative for adenopathy.    Past Medical History: Past Medical History:  Diagnosis Date   Breast cancer ([place]) 2021   Cyst, ovarian    SVT (supraventricular tachycardia) ([place])    She was diagnosed with ductal carcinoma of the left breast for which she underwent left total mastectomy and sentinel node biopsy in September 2021 (recent surgical oncology note from 12/23/2020 reviewed).  Past Surgical History: Past Surgical History:  Procedure Laterality Date   MASTECTOMY Left 09/2019   with reconstruction     Family History: Family History  Problem Relation Age of Onset   Hypotension Mother    Heart disease Father    Diabetes Father    Prostate cancer Father        dx 33s   Leukemia Maternal Grandmother    Prostate cancer Maternal Grandfather        metastatic; dx 57s   Cancer Paternal Grandmother  unknown type; dx 80s   Prostate cancer Maternal Uncle        metastatic; dx 60s   Prostate cancer Paternal Uncle        metastatic; dx 61s    Social History: Social History   Socioeconomic History   Marital status: Married    Spouse name: Not on file   Number of children: Not on file   Years of education: Not on file   Highest education level: Not on file  Occupational History   Not on file  Tobacco Use   Smoking status: Never    Smokeless tobacco: Never  Vaping Use   Vaping Use: Never used  Substance and Sexual Activity   Alcohol use: Yes   Drug use: Never   Sexual activity: Yes    Birth control/protection: Pill  Other Topics Concern   Not on file  Social History Narrative   Not on file   Social Determinants of Health   Financial Resource Strain: Not on file  Food Insecurity: Not on file  Transportation Needs: Not on file  Physical Activity: Not on file  Stress: Not on file  Social Connections: Not on file    Allergies: Allergies  Allergen Reactions   Latex Hives and Itching    Outpatient Meds: Current Outpatient Medications  Medication Sig Dispense Refill   Cholecalciferol (VITAMIN D) 50 MCG (2000 UT) CAPS Take 1 capsule by mouth daily.     tamoxifen (NOLVADEX) 10 MG tablet Take 1 tablet (10 mg total) by mouth daily. 90 tablet 3   Turmeric 500 MG CAPS Take 1 capsule by mouth daily.     vitamin E 180 MG (400 UNITS) capsule Take 400 Units by mouth daily.     No current facility-administered medications for this visit.      ___________________________________________________________________ Objective   Exam:  BP 112/66    Pulse 93    Ht '5\' 3"'$  (1.6 m)    Wt 185 lb (83.9 kg)    LMP 03/08/2021 (Approximate)    SpO2 97%    BMI 32.77 kg/m  Wt Readings from Last 3 Encounters:  03/17/21 185 lb (83.9 kg)  10/05/19 177 lb 1.6 oz (80.3 kg)  08/19/19 182 lb 12.8 oz (82.9 kg)    General: She is well-appearing Eyes: sclera anicteric, no redness ENT: oral mucosa moist without lesions, no cervical or supraclavicular lymphadenopathy CV: RRR without murmur, S1/S2, no JVD, no peripheral edema Resp: clear to auscultation bilaterally, normal RR and effort noted GI: soft, no tenderness, with active bowel sounds. No guarding or palpable organomegaly noted.  Transverse lower abdominal scar and scar around umbilicus from her plastic surgery.  There is a firm palpable abnormality in the midline below the  umbilicus Skin; warm and dry, no rash or jaundice noted Neuro: awake, alert and oriented x 3. Normal gross motor function and fluent speech  Labs:  None recent  Radiologic Studies:  Narrative & Impression CLINICAL DATA:  Right abdominal pain   EXAM: ABDOMEN ULTRASOUND COMPLETE   COMPARISON:  None.   FINDINGS: Gallbladder: No gallstones, gallbladder wall thickening, or pericholecystic fluid.   Common bile duct: Diameter: 3 mm   Liver: No focal lesion identified. Within normal limits in parenchymal echogenicity. Portal vein is patent on color Doppler imaging with normal direction of blood flow towards the liver.   IVC: No abnormality visualized.   Pancreas: Visualized portion unremarkable.   Spleen: Size and appearance within normal limits.   Right Kidney: Length: 11.1 cm.  Echogenicity within normal limits. No mass or hydronephrosis visualized.   Left Kidney: Length: 10.6 cm. Echogenicity within normal limits. No mass or hydronephrosis visualized.   Abdominal aorta: No aneurysm visualized.   Other findings: None.   IMPRESSION: Negative abdominal ultrasound.     Electronically Signed   By: Julian Hy M.D.   On: 12/16/2018 14:19   Assessment: Encounter Diagnoses  Name Primary?   Esophageal dysphagia Yes   Abdominal bloating    Chronic constipation    Abdominal fullness in suprapubic region     History most consistent with functional constipation, less consistent with pelvic floor dysfunction/dyssynergic defecation. I believe this is leading to chronic abdominal bloating as well.  Return of esophageal dysphagia, previous EGD without esophagitis or esophageal biopsy abnormalities.  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  Plan:  Abdominal  pelvic CT scan with oral and IV contrast to evaluate the lower abdominal palpable fullness.  She recalls her gynecologist telling her she had a very small fibroid in the past, but perhaps that has become more significant or there is other abdominal/pelvic space-occupying lesion.  Upper endoscopy with dilation.  She was agreeable after discussion of procedure and risks.  The benefits and risks of the planned procedure were described in detail with the patient or (when appropriate) their health care proxy.  Risks were outlined as including, but not limited to, bleeding, infection, perforation, adverse medication reaction leading to cardiac or pulmonary decompensation, pancreatitis (if ERCP).  The limitation of incomplete mucosal visualization was also discussed.  No guarantees or warranties were given.  Trial of Motegrity 2 mg once daily, 14 days of samples given.   40 minutes were spent on this encounter (including chart review, history/exam, counseling/coordination of care, and documentation) > 50% of that time was spent on counseling and coordination of care.   Nelida Meuse III  CC: Referring provider noted above

## 2021-03-17 NOTE — Patient Instructions (Signed)
If you are age 42 or older, your body mass index should be between 23-30. Your Body mass index is 32.77 kg/m?Marland Kitchen If this is out of the aforementioned range listed, please consider follow up with your Primary Care Provider. ? ?If you are age 80 or younger, your body mass index should be between 19-25. Your Body mass index is 32.77 kg/m?Marland Kitchen If this is out of the aformentioned range listed, please consider follow up with your Primary Care Provider.  ? ?________________________________________________________ ? ?The [facility] providers would like to encourage you to use [facility] to communicate with providers for non-urgent requests or questions.  Due to long hold times on the telephone, sending your provider a message by [facility] may be a faster and more efficient way to get a response.  Please allow 48 business hours for a response.  Please remember that this is for non-urgent requests.  ?_______________________________________________________ ? ?You have been scheduled for a CT scan of the abdomen and pelvis at [facility] ([address]---this is in the same building as Press [facility]).  ? ?You are scheduled on 03/22/2021 at 11:30am. You should arrive 15 minutes prior to your appointment time for registration. Please follow the written instructions below on the day of your exam: ? ?WARNING: IF YOU ARE ALLERGIC TO IODINE/X-RAY DYE, PLEASE NOTIFY RADIOLOGY IMMEDIATELY AT [phone]! YOU WILL BE GIVEN A 13 HOUR PREMEDICATION PREP. ? ?1) Do not eat anything after 7:30am (4 hours prior to your test) ?2) You have been given 2 bottles of oral contrast to drink. The solution may taste               better if refrigerated, but do NOT add ice or any other liquid to this solution. Shake             well before drinking. ?  ? Drink 1 bottle of contrast @ 9:30am (2 hours prior to your exam) ? Drink 1 bottle of contrast @ 10:30am (1 hour prior to your exam) ? ?You may take any medications as prescribed with a small  amount of water except for the following: Metformin, Glucophage, Glucovance, Avandamet, Riomet, Fortamet, Actoplus Met, Janumet, Glumetza or Metaglip. The above medications must be held the day of the exam AND 48 hours after the exam. ? ?The purpose of you drinking the oral contrast is to aid in the visualization of your intestinal tract. The contrast solution may cause some diarrhea. Before your exam is started, you will be given a small amount of fluid to drink. Depending on your individual set of symptoms, you may also receive an intravenous injection of x-ray contrast/dye. Plan on being at [facility] for 30 minutes or long, depending on the type of exam you are having performed. ? ?If you have any questions regarding your exam or if you need to reschedule, you may call the CT department at [phone] between the hours of 8:00 am and 5:00 pm, Monday-Friday. ? ?_________________________________________________________ ?You have been scheduled for an endoscopy. Please follow written instructions given to you at your visit today. ?If you use inhalers (even only as needed), please bring them with you on the day of your procedure. ? ?You have been given samples of Motegrity - take one 30 minutes before your first meal of the day ?

## 2021-03-22 ENCOUNTER — Ambulatory Visit (INDEPENDENT_AMBULATORY_CARE_PROVIDER_SITE_OTHER)
Admission: RE | Admit: 2021-03-22 | Discharge: 2021-03-22 | Disposition: A | Discharge status: Home / Self Care | Payer: BC Managed Care – PPO | Source: Ambulatory Visit | Attending: Gastroenterology | Admitting: Gastroenterology

## 2021-03-22 ENCOUNTER — Other Ambulatory Visit: Payer: Self-pay

## 2021-03-22 DIAGNOSIS — K5909 Other constipation: Secondary | ICD-10-CM

## 2021-03-22 DIAGNOSIS — R109 Unspecified abdominal pain: Secondary | ICD-10-CM | POA: Diagnosis not present

## 2021-03-22 DIAGNOSIS — R140 Abdominal distension (gaseous): Secondary | ICD-10-CM | POA: Diagnosis not present

## 2021-03-22 DIAGNOSIS — R198 Other specified symptoms and signs involving the digestive system and abdomen: Secondary | ICD-10-CM | POA: Diagnosis not present

## 2021-03-22 DIAGNOSIS — R1319 Other dysphagia: Secondary | ICD-10-CM

## 2021-03-22 MED ORDER — IOHEXOL 300 MG/ML  SOLN
100.0000 mL | Freq: Once | INTRAMUSCULAR | Status: AC | PRN
  Administered 2021-03-22: 100 mL via INTRAVENOUS

## 2021-03-27 DIAGNOSIS — D219 Benign neoplasm of connective and other soft tissue, unspecified: Secondary | ICD-10-CM | POA: Diagnosis not present

## 2021-03-27 DIAGNOSIS — N939 Abnormal uterine and vaginal bleeding, unspecified: Secondary | ICD-10-CM | POA: Diagnosis not present

## 2021-04-11 DIAGNOSIS — D219 Benign neoplasm of connective and other soft tissue, unspecified: Secondary | ICD-10-CM | POA: Diagnosis not present

## 2021-04-11 DIAGNOSIS — N3289 Other specified disorders of bladder: Secondary | ICD-10-CM | POA: Diagnosis not present

## 2021-04-11 DIAGNOSIS — D259 Leiomyoma of uterus, unspecified: Secondary | ICD-10-CM | POA: Diagnosis not present

## 2021-04-13 DIAGNOSIS — R3121 Asymptomatic microscopic hematuria: Secondary | ICD-10-CM | POA: Diagnosis not present

## 2021-04-13 DIAGNOSIS — D414 Neoplasm of uncertain behavior of bladder: Secondary | ICD-10-CM | POA: Diagnosis not present

## 2021-04-19 DIAGNOSIS — D259 Leiomyoma of uterus, unspecified: Secondary | ICD-10-CM | POA: Diagnosis not present

## 2021-04-19 DIAGNOSIS — N939 Abnormal uterine and vaginal bleeding, unspecified: Secondary | ICD-10-CM | POA: Diagnosis not present

## 2021-04-20 ENCOUNTER — Telehealth: Payer: Self-pay | Admitting: Gastroenterology

## 2021-04-20 ENCOUNTER — Encounter: Payer: BC Managed Care – PPO | Admitting: Gastroenterology

## 2021-04-20 NOTE — Telephone Encounter (Signed)
Patient called to cancel procedure scheduled for today. Per patient, she had an accident and has severe back pain now. Patient is going to ED for that. Patient rescheduled procedure 05/29/21. ?

## 2021-05-12 DIAGNOSIS — N809 Endometriosis, unspecified: Secondary | ICD-10-CM | POA: Diagnosis not present

## 2021-05-12 DIAGNOSIS — N329 Bladder disorder, unspecified: Secondary | ICD-10-CM | POA: Diagnosis not present

## 2021-05-12 DIAGNOSIS — D259 Leiomyoma of uterus, unspecified: Secondary | ICD-10-CM | POA: Diagnosis not present

## 2021-05-17 DIAGNOSIS — N390 Urinary tract infection, site not specified: Secondary | ICD-10-CM | POA: Diagnosis not present

## 2021-05-23 DIAGNOSIS — I471 Supraventricular tachycardia: Secondary | ICD-10-CM | POA: Diagnosis not present

## 2021-05-23 DIAGNOSIS — Z5181 Encounter for therapeutic drug level monitoring: Secondary | ICD-10-CM | POA: Diagnosis not present

## 2021-05-23 DIAGNOSIS — Z79810 Long term (current) use of selective estrogen receptor modulators (SERMs): Secondary | ICD-10-CM | POA: Diagnosis not present

## 2021-05-23 DIAGNOSIS — D259 Leiomyoma of uterus, unspecified: Secondary | ICD-10-CM | POA: Diagnosis not present

## 2021-05-23 DIAGNOSIS — M2550 Pain in unspecified joint: Secondary | ICD-10-CM | POA: Diagnosis not present

## 2021-05-23 DIAGNOSIS — D0512 Intraductal carcinoma in situ of left breast: Secondary | ICD-10-CM | POA: Diagnosis not present

## 2021-05-23 DIAGNOSIS — N924 Excessive bleeding in the premenopausal period: Secondary | ICD-10-CM | POA: Diagnosis not present

## 2021-05-23 DIAGNOSIS — D500 Iron deficiency anemia secondary to blood loss (chronic): Secondary | ICD-10-CM | POA: Diagnosis not present

## 2021-05-23 DIAGNOSIS — R768 Other specified abnormal immunological findings in serum: Secondary | ICD-10-CM | POA: Diagnosis not present

## 2021-05-23 DIAGNOSIS — K429 Umbilical hernia without obstruction or gangrene: Secondary | ICD-10-CM | POA: Diagnosis not present

## 2021-05-29 ENCOUNTER — Encounter: Payer: BC Managed Care – PPO | Admitting: Gastroenterology

## 2021-06-14 DIAGNOSIS — D0512 Intraductal carcinoma in situ of left breast: Secondary | ICD-10-CM | POA: Diagnosis not present

## 2021-06-16 DIAGNOSIS — Z01818 Encounter for other preprocedural examination: Secondary | ICD-10-CM | POA: Diagnosis not present

## 2021-07-12 DIAGNOSIS — N939 Abnormal uterine and vaginal bleeding, unspecified: Secondary | ICD-10-CM | POA: Diagnosis not present

## 2021-07-12 DIAGNOSIS — Z791 Long term (current) use of non-steroidal anti-inflammatories (NSAID): Secondary | ICD-10-CM | POA: Diagnosis not present

## 2021-07-12 DIAGNOSIS — Z9012 Acquired absence of left breast and nipple: Secondary | ICD-10-CM | POA: Diagnosis not present

## 2021-07-12 DIAGNOSIS — M419 Scoliosis, unspecified: Secondary | ICD-10-CM | POA: Diagnosis not present

## 2021-07-12 DIAGNOSIS — K429 Umbilical hernia without obstruction or gangrene: Secondary | ICD-10-CM | POA: Diagnosis not present

## 2021-07-12 DIAGNOSIS — C50012 Malignant neoplasm of nipple and areola, left female breast: Secondary | ICD-10-CM | POA: Diagnosis not present

## 2021-07-12 DIAGNOSIS — N650 Deformity of reconstructed breast: Secondary | ICD-10-CM | POA: Diagnosis not present

## 2021-07-12 DIAGNOSIS — M1990 Unspecified osteoarthritis, unspecified site: Secondary | ICD-10-CM | POA: Diagnosis not present

## 2021-07-12 DIAGNOSIS — Z853 Personal history of malignant neoplasm of breast: Secondary | ICD-10-CM | POA: Diagnosis not present

## 2021-07-12 DIAGNOSIS — M6208 Separation of muscle (nontraumatic), other site: Secondary | ICD-10-CM | POA: Diagnosis not present

## 2021-07-12 DIAGNOSIS — Z79899 Other long term (current) drug therapy: Secondary | ICD-10-CM | POA: Diagnosis not present

## 2021-07-12 DIAGNOSIS — Z91040 Latex allergy status: Secondary | ICD-10-CM | POA: Diagnosis not present

## 2021-07-12 DIAGNOSIS — D630 Anemia in neoplastic disease: Secondary | ICD-10-CM | POA: Diagnosis not present

## 2021-07-12 DIAGNOSIS — Z79810 Long term (current) use of selective estrogen receptor modulators (SERMs): Secondary | ICD-10-CM | POA: Diagnosis not present

## 2021-07-12 DIAGNOSIS — D251 Intramural leiomyoma of uterus: Secondary | ICD-10-CM | POA: Diagnosis not present

## 2021-07-12 DIAGNOSIS — L905 Scar conditions and fibrosis of skin: Secondary | ICD-10-CM | POA: Diagnosis not present

## 2021-07-12 DIAGNOSIS — M958 Other specified acquired deformities of musculoskeletal system: Secondary | ICD-10-CM | POA: Diagnosis not present

## 2021-07-12 DIAGNOSIS — D259 Leiomyoma of uterus, unspecified: Secondary | ICD-10-CM | POA: Diagnosis not present

## 2021-07-12 HISTORY — PX: HYSTERECTOMY ABDOMINAL WITH SALPINGECTOMY: SHX6725

## 2021-07-19 DIAGNOSIS — D0512 Intraductal carcinoma in situ of left breast: Secondary | ICD-10-CM | POA: Diagnosis not present

## 2021-08-04 DIAGNOSIS — D0512 Intraductal carcinoma in situ of left breast: Secondary | ICD-10-CM | POA: Diagnosis not present

## 2021-08-04 DIAGNOSIS — Z1231 Encounter for screening mammogram for malignant neoplasm of breast: Secondary | ICD-10-CM | POA: Diagnosis not present

## 2021-10-13 DIAGNOSIS — M1990 Unspecified osteoarthritis, unspecified site: Secondary | ICD-10-CM | POA: Diagnosis not present

## 2021-10-24 ENCOUNTER — Encounter: Payer: Self-pay | Admitting: Gastroenterology

## 2021-10-24 NOTE — Telephone Encounter (Signed)
Yes, she can be directly booked for an EGD and a colonoscopy with me in the [facility].  Split dose GoLytely prep (with Reglan 5 mg before evening prep and another dose before morning prep) due to chronic constipation.  - HD

## 2021-10-25 NOTE — Telephone Encounter (Signed)
Called and spoke with patient. She has been scheduled for a PV appt on Wednesday, 11/08/21 at 2:30 pm. ECL in the [facility] is scheduled for Wednesday, 11/22/21 at 2 pm. Pt is aware that she will need to arrive in the [facility] by 1 pm with a care partner. Pt states that she take Tamoxifen daily and was told by her oncologist that she needs to hold this prior to any procedure. Pt is going to check with oncology and get back to [place] about this. Pt had no concerns at the end of the call. Prep instructions added to PV appt note.

## 2021-10-30 DIAGNOSIS — Z0000 Encounter for general adult medical examination without abnormal findings: Secondary | ICD-10-CM | POA: Diagnosis not present

## 2021-10-30 DIAGNOSIS — Z13220 Encounter for screening for lipoid disorders: Secondary | ICD-10-CM | POA: Diagnosis not present

## 2021-10-31 DIAGNOSIS — J069 Acute upper respiratory infection, unspecified: Secondary | ICD-10-CM | POA: Diagnosis not present

## 2021-11-13 DIAGNOSIS — R5383 Other fatigue: Secondary | ICD-10-CM | POA: Diagnosis not present

## 2021-11-13 DIAGNOSIS — M1990 Unspecified osteoarthritis, unspecified site: Secondary | ICD-10-CM | POA: Diagnosis not present

## 2021-11-13 DIAGNOSIS — Z6831 Body mass index (BMI) 31.0-31.9, adult: Secondary | ICD-10-CM | POA: Diagnosis not present

## 2021-11-13 DIAGNOSIS — Z131 Encounter for screening for diabetes mellitus: Secondary | ICD-10-CM | POA: Diagnosis not present

## 2021-11-13 DIAGNOSIS — Z833 Family history of diabetes mellitus: Secondary | ICD-10-CM | POA: Diagnosis not present

## 2021-11-13 DIAGNOSIS — E8889 Other specified metabolic disorders: Secondary | ICD-10-CM | POA: Diagnosis not present

## 2021-11-14 ENCOUNTER — Ambulatory Visit (AMBULATORY_SURGERY_CENTER): Payer: Self-pay

## 2021-11-14 VITALS — Ht 63.0 in | Wt 179.0 lb

## 2021-11-14 DIAGNOSIS — Z1211 Encounter for screening for malignant neoplasm of colon: Secondary | ICD-10-CM

## 2021-11-14 DIAGNOSIS — K5909 Other constipation: Secondary | ICD-10-CM

## 2021-11-14 DIAGNOSIS — R1319 Other dysphagia: Secondary | ICD-10-CM

## 2021-11-14 MED ORDER — PEG 3350-KCL-NA BICARB-NACL 420 G PO SOLR: 4000.0000 mL | Freq: Once | ORAL | 0 refills | Status: AC

## 2021-11-14 MED ORDER — METOCLOPRAMIDE HCL 5 MG PO TABS: 5.0000 mg | ORAL_TABLET | Freq: Every day | ORAL | 0 refills | Status: AC

## 2021-11-14 NOTE — Progress Notes (Signed)

## 2021-11-14 NOTE — Addendum Note (Signed)
Addended by: Richarda Blade on: 11/14/2021 10:39 AM   Modules accepted: Orders

## 2021-11-15 ENCOUNTER — Encounter: Payer: Self-pay | Admitting: Gastroenterology

## 2021-11-22 ENCOUNTER — Ambulatory Visit (AMBULATORY_SURGERY_CENTER): Payer: BC Managed Care – PPO | Admitting: Gastroenterology

## 2021-11-22 ENCOUNTER — Encounter: Payer: Self-pay | Admitting: Gastroenterology

## 2021-11-22 VITALS — BP 132/58 | HR 55 | Temp 96.9°F | Resp 15

## 2021-11-22 DIAGNOSIS — R0989 Other specified symptoms and signs involving the circulatory and respiratory systems: Secondary | ICD-10-CM | POA: Diagnosis not present

## 2021-11-22 DIAGNOSIS — K5909 Other constipation: Secondary | ICD-10-CM

## 2021-11-22 DIAGNOSIS — R1319 Other dysphagia: Secondary | ICD-10-CM

## 2021-11-22 MED ORDER — SODIUM CHLORIDE 0.9 % IV SOLN: 500.0000 mL | Freq: Once | INTRAVENOUS | Status: DC

## 2021-11-22 NOTE — Progress Notes (Signed)
Called to room to assist during endoscopic procedure.  Patient ID and intended procedure confirmed with present staff. Received instructions for my participation in the procedure from the performing physician.  

## 2021-11-22 NOTE — Patient Instructions (Addendum)
- Patient has a contact number available for emergencies. The signs and symptoms of potential delayed complications were discussed with the patient. Return to normal activities tomorrow. Written discharge instructions were provided to the patient. - Resume previous diet. - Continue present medications. - Repeat colonoscopy in 10 years for screening purposes. - See the other procedure note for documentation of additional recommendations.  - Patient has a contact number available for emergencies. The signs and symptoms of potential delayed complications were discussed with the patient. Return to normal activities tomorrow. Written discharge instructions were provided to the patient. - Resume previous diet. - Continue present medications. - See the other procedure note for documentation of additional recommendations.  YOU HAD AN ENDOSCOPIC PROCEDURE TODAY AT [facility]:   Refer to the procedure report that was given to you for any specific questions about what was found during the examination.  If the procedure report does not answer your questions, please call your gastroenterologist to clarify.  If you requested that your care partner not be given the details of your procedure findings, then the procedure report has been included in a sealed envelope for you to review at your convenience later.  YOU SHOULD EXPECT: Some feelings of bloating in the abdomen. Passage of more gas than usual.  Walking can help get rid of the air that was put into your GI tract during the procedure and reduce the bloating. If you had a lower endoscopy (such as a colonoscopy or flexible sigmoidoscopy) you may notice spotting of blood in your stool or on the toilet paper. If you underwent a bowel prep for your procedure, you may not have a normal bowel movement for a few days.  Please Note:  You might notice some irritation and congestion in your nose or some drainage.  This is from the oxygen used during  your procedure.  There is no need for concern and it should clear up in a day or so.  SYMPTOMS TO REPORT IMMEDIATELY:  Following lower endoscopy (colonoscopy or flexible sigmoidoscopy):  Excessive amounts of blood in the stool  Significant tenderness or worsening of abdominal pains  Swelling of the abdomen that is new, acute  Fever of 100F or higher  Following upper endoscopy (EGD)  Vomiting of blood or coffee ground material  New chest pain or pain under the shoulder blades  Painful or persistently difficult swallowing  New shortness of breath  Fever of 100F or higher  Black, tarry-looking stools  For urgent or emergent issues, a gastroenterologist can be reached at any hour by calling [phone]. Do not use MyChart messaging for urgent concerns.    DIET:  We do recommend a small meal at first, but then you may proceed to your regular diet.  Drink plenty of fluids but you should avoid alcoholic beverages for 24 hours.  ACTIVITY:  You should plan to take it easy for the rest of today and you should NOT DRIVE or use heavy machinery until tomorrow (because of the sedation medicines used during the test).    FOLLOW UP: Our staff will call the number listed on your records the next business day following your procedure.  We will call around 7:15- 8:00 am to check on you and address any questions or concerns that you may have regarding the information given to you following your procedure. If we do not reach you, we will leave a message.     If any biopsies were taken you will be contacted  by phone or by letter within the next 1-3 weeks.  Please call us at [phone] if you have not heard about the biopsies in 3 weeks.    SIGNATURES/CONFIDENTIALITY: You and/or your care partner have signed paperwork which will be entered into your electronic medical record.  These signatures attest to the fact that that the information above on your After Visit Summary has been reviewed and is  understood.  Full responsibility of the confidentiality of this discharge information lies with you and/or your care-partner.

## 2021-11-22 NOTE — Progress Notes (Signed)
Pt's states no medical or surgical changes since previsit or office visit. 

## 2021-11-22 NOTE — Op Note (Signed)
[facility] Patient Name: Bethany Weaver Procedure Date: 11/22/2021 2:06 PM MRN: [id number] Endoscopist: [place]. Loletha Carrow , MD, [id number] Age: 41 Referring MD:  Date of Birth: 1980/04/10 Gender: Female Account #: [id number] Procedure:                Colonoscopy Indications:              Constipation Medicines:                Monitored Anesthesia Care Procedure:                Pre-Anesthesia Assessment:                           - Prior to the procedure, a History and Physical                            was performed, and patient medications and                            allergies were reviewed. The patient's tolerance of                            previous anesthesia was also reviewed. The risks                            and benefits of the procedure and the sedation                            options and risks were discussed with the patient.                            All questions were answered, and informed consent                            was obtained. Prior Anticoagulants: The patient has                            taken no anticoagulant or antiplatelet agents. ASA                            Grade Assessment: II - A patient with mild systemic                            disease. After reviewing the risks and benefits,                            the patient was deemed in satisfactory condition to                            undergo the procedure.                           After obtaining informed consent, the colonoscope  was passed under direct vision. Throughout the                            procedure, the patient's blood pressure, pulse, and                            oxygen saturations were monitored continuously. The                            CF [id number] #[id number] was introduced through the anus                            and advanced to the the cecum, identified by                            appendiceal orifice and ileocecal valve. The                             colonoscopy was somewhat difficult due to a                            redundant colon and a tortuous colon. Successful                            completion of the procedure was aided by changing                            the patient's position (semi-supine), using manual                            pressure and straightening and shortening the scope                            to obtain bowel loop reduction. The patient                            tolerated the procedure well. The quality of the                            bowel preparation was excellent. The ileocecal                            valve, appendiceal orifice, and rectum were                            photographed. Scope In: 2:20:30 PM Scope Out: 2:36:33 PM Scope Withdrawal Time: 0 hours 8 minutes 8 seconds  Total Procedure Duration: 0 hours 16 minutes 3 seconds  Findings:                 The perianal and digital rectal examinations were                            normal.  There is no endoscopic evidence of polyps in the                            entire colon.                           Repeat examination of right colon under NBI                            performed.                           The sigmoid colon was redundant and tortuous.                           The exam was otherwise without abnormality on                            direct and retroflexion views. Complications:            No immediate complications. Estimated Blood Loss:     Estimated blood loss: none. Impression:               - Redundant colon.                           - The examination was otherwise normal on direct                            and retroflexion views.                           - No specimens collected. Recommendation:           - Patient has a contact number available for                            emergencies. The signs and symptoms of potential                            delayed  complications were discussed with the                            patient. Return to normal activities tomorrow.                            Written discharge instructions were provided to the                            patient.                           - Resume previous diet.                           - Continue present medications.                           -  Repeat colonoscopy in 10 years for screening                            purposes.                           - See the other procedure note for documentation of                            additional recommendations. Farrah Skoda L. Loletha Carrow, MD 11/22/2021 2:40:55 PM This report has been signed electronically.

## 2021-11-22 NOTE — Op Note (Signed)
[name]: Bethany Weaver Procedure Date: 11/22/2021 2:07 PM MRN: [id number] Endoscopist: [place]. Bethany Weaver , MD, [id number] Age: 41 Referring MD:  Date of Birth: May 31, 1980 Gender: Female Account #: [id number] Procedure:                Upper GI endoscopy Indications:              Pharyngeal phase dysphagia, Globus sensation Medicines:                Monitored Anesthesia Care Procedure:                Pre-Anesthesia Assessment:                           - Prior to the procedure, a History and Physical                            was performed, and patient medications and                            allergies were reviewed. The patient's tolerance of                            previous anesthesia was also reviewed. The risks                            and benefits of the procedure and the sedation                            options and risks were discussed with the patient.                            All questions were answered, and informed consent                            was obtained. Prior Anticoagulants: The patient has                            taken no anticoagulant or antiplatelet agents. ASA                            Grade Assessment: II - A patient with mild systemic                            disease. After reviewing the risks and benefits,                            the patient was deemed in satisfactory condition to                            undergo the procedure.                           - Prior to the procedure, a History and Physical  was performed, and patient medications and                            allergies were reviewed. The patient's tolerance of                            previous anesthesia was also reviewed. The risks                            and benefits of the procedure and the sedation                            options and risks were discussed with the patient.                            All questions  were answered, and informed consent                            was obtained. Prior Anticoagulants: The patient has                            taken no anticoagulant or antiplatelet agents. ASA                            Grade Assessment: II - A patient with mild systemic                            disease. After reviewing the risks and benefits,                            the patient was deemed in satisfactory condition to                            undergo the procedure.                           After obtaining informed consent, the endoscope was                            passed under direct vision. Throughout the                            procedure, the patient's blood pressure, pulse, and                            oxygen saturations were monitored continuously. The                            Endoscope was introduced through the mouth, and                            advanced to the second part of duodenum. The upper  GI endoscopy was accomplished without difficulty.                            The patient tolerated the procedure well. Scope In: Scope Out: Findings:                 The larynx was normal.                           The examined duodenum was normal.                           The stomach was normal.                           The cardia and gastric fundus were normal on                            retroflexion.                           The examined esophagus was normal. The scope was                            withdrawn. Dilation was performed with a Maloney                            dilator with no resistance at 52 Fr. Complications:            No immediate complications. Estimated Blood Loss:     Estimated blood loss: none. Impression:               - Normal larynx.                           - Normal examined duodenum.                           - Normal stomach.                           - Normal esophagus. Dilated.                            - No specimens collected. Recommendation:           - Patient has a contact number available for                            emergencies. The signs and symptoms of potential                            delayed complications were discussed with the                            patient. Return to normal activities tomorrow.                            Written  discharge instructions were provided to the                            patient.                           - Resume previous diet.                           - Continue present medications.                           - See the other procedure note for documentation of                            additional recommendations. Bethany Weaver L. Bethany Carrow, MD 11/22/2021 2:52:51 PM This report has been signed electronically.

## 2021-11-22 NOTE — Progress Notes (Signed)
History and Physical:  This patient presents for endoscopic testing for: Encounter Diagnoses  Name Primary?   Chronic constipation Yes   Esophageal dysphagia     41 year old woman here for upper endoscopy and colonoscopy for esophageal dysphagia and chronic constipation.  Last seen in the office March 2023, further clinical details in that note. She reports symptomatic improvement in dysphagia over the previous endoscopic dilation performed at another GI clinic.  At last office visit she was given samples of Motegrity for the constipation.  Patient is otherwise without complaints or active issues today.   Past Medical History: Past Medical History:  Diagnosis Date   Breast cancer ([place]) 2021   Cyst, ovarian    GERD (gastroesophageal reflux disease)    SVT (supraventricular tachycardia)      Past Surgical History: Past Surgical History:  Procedure Laterality Date   HYSTERECTOMY ABDOMINAL WITH SALPINGECTOMY  07/12/2021   [facility]   MASTECTOMY Left 09/2019   with reconstruction    Allergies: Allergies  Allergen Reactions   Latex Hives and Itching    Outpatient Meds: Current Outpatient Medications  Medication Sig Dispense Refill   Cholecalciferol (VITAMIN D) 50 MCG (2000 UT) CAPS Take 1 capsule by mouth daily.     metoCLOPramide (REGLAN) 5 MG tablet Take 1 tablet (5 mg total) by mouth daily for 2 days. 2 tablet 0   Multiple Vitamin (MULTI-VITAMIN) tablet Take 1 tablet by mouth daily.     tamoxifen (NOLVADEX) 10 MG tablet Take 1 tablet (10 mg total) by mouth daily. 90 tablet 3   Turmeric 500 MG CAPS Take 1 capsule by mouth daily.     vitamin E 180 MG (400 UNITS) capsule Take 400 Units by mouth daily.     Current Facility-Administered Medications  Medication Dose Route Frequency Provider Last Rate Last Admin   0.9 %  sodium chloride infusion  500 mL Intravenous Once Nelida Meuse III, MD           ___________________________________________________________________ Objective   Exam:  Temp (!) 96.9 F (36.1 C)   LMP 03/08/2021 (Approximate)   CV: regular , S1/S2 Resp: clear to auscultation bilaterally, normal RR and effort noted GI: soft, no tenderness, with active bowel sounds.   Assessment: Encounter Diagnoses  Name Primary?   Chronic constipation Yes   Esophageal dysphagia      Plan: Colonoscopy EGD with probable dilation   The patient is appropriate for an endoscopic procedure in the ambulatory setting.   - Wilfrid Lund, MD

## 2021-11-22 NOTE — Progress Notes (Signed)
A and O x3. Report to RN. Tolerated MAC anesthesia well.Teeth unchanged after procedure. 

## 2021-11-23 ENCOUNTER — Telehealth: Payer: Self-pay | Admitting: *Deleted

## 2021-11-23 NOTE — Telephone Encounter (Signed)
Attempted post-procedure follow-up call. No answer. Left voicemail.

## 2021-11-24 DIAGNOSIS — Z6832 Body mass index (BMI) 32.0-32.9, adult: Secondary | ICD-10-CM | POA: Diagnosis not present

## 2021-11-24 DIAGNOSIS — Z0000 Encounter for general adult medical examination without abnormal findings: Secondary | ICD-10-CM | POA: Diagnosis not present

## 2021-11-24 DIAGNOSIS — R070 Pain in throat: Secondary | ICD-10-CM | POA: Diagnosis not present

## 2021-11-24 DIAGNOSIS — L568 Other specified acute skin changes due to ultraviolet radiation: Secondary | ICD-10-CM | POA: Diagnosis not present

## 2021-12-04 ENCOUNTER — Other Ambulatory Visit (HOSPITAL_COMMUNITY): Payer: Self-pay

## 2021-12-04 DIAGNOSIS — E559 Vitamin D deficiency, unspecified: Secondary | ICD-10-CM | POA: Diagnosis not present

## 2021-12-04 DIAGNOSIS — Z9189 Other specified personal risk factors, not elsewhere classified: Secondary | ICD-10-CM | POA: Diagnosis not present

## 2021-12-04 MED ORDER — WEGOVY 0.5 MG/0.5ML ~~LOC~~ SOAJ
0.5000 mg | SUBCUTANEOUS | 0 refills | Status: DC
  Filled 2021-12-04 – 2022-02-22 (×3): qty 2, 28d supply, fill #0

## 2021-12-04 MED ORDER — WEGOVY 0.25 MG/0.5ML ~~LOC~~ SOAJ
0.2500 mg | SUBCUTANEOUS | 0 refills | Status: AC
  Filled 2021-12-04: qty 2, 28d supply, fill #0

## 2021-12-04 MED ORDER — WEGOVY 1 MG/0.5ML ~~LOC~~ SOAJ
1.0000 mg | SUBCUTANEOUS | 0 refills | Status: DC
  Filled 2021-12-04 – 2022-02-07 (×2): qty 2, 28d supply, fill #0

## 2021-12-05 ENCOUNTER — Other Ambulatory Visit (HOSPITAL_COMMUNITY): Payer: Self-pay

## 2021-12-18 DIAGNOSIS — Z79810 Long term (current) use of selective estrogen receptor modulators (SERMs): Secondary | ICD-10-CM | POA: Diagnosis not present

## 2021-12-18 DIAGNOSIS — K429 Umbilical hernia without obstruction or gangrene: Secondary | ICD-10-CM | POA: Diagnosis not present

## 2021-12-18 DIAGNOSIS — I471 Supraventricular tachycardia, unspecified: Secondary | ICD-10-CM | POA: Diagnosis not present

## 2021-12-18 DIAGNOSIS — R768 Other specified abnormal immunological findings in serum: Secondary | ICD-10-CM | POA: Diagnosis not present

## 2021-12-18 DIAGNOSIS — D500 Iron deficiency anemia secondary to blood loss (chronic): Secondary | ICD-10-CM | POA: Diagnosis not present

## 2021-12-18 DIAGNOSIS — D0512 Intraductal carcinoma in situ of left breast: Secondary | ICD-10-CM | POA: Diagnosis not present

## 2021-12-18 DIAGNOSIS — N924 Excessive bleeding in the premenopausal period: Secondary | ICD-10-CM | POA: Diagnosis not present

## 2021-12-18 DIAGNOSIS — Z5181 Encounter for therapeutic drug level monitoring: Secondary | ICD-10-CM | POA: Diagnosis not present

## 2021-12-18 DIAGNOSIS — M2550 Pain in unspecified joint: Secondary | ICD-10-CM | POA: Diagnosis not present

## 2022-01-02 ENCOUNTER — Other Ambulatory Visit (HOSPITAL_COMMUNITY): Payer: Self-pay

## 2022-01-10 ENCOUNTER — Other Ambulatory Visit (HOSPITAL_COMMUNITY): Payer: Self-pay

## 2022-01-10 DIAGNOSIS — Z9189 Other specified personal risk factors, not elsewhere classified: Secondary | ICD-10-CM | POA: Diagnosis not present

## 2022-01-10 DIAGNOSIS — E559 Vitamin D deficiency, unspecified: Secondary | ICD-10-CM | POA: Diagnosis not present

## 2022-01-26 DIAGNOSIS — U071 COVID-19: Secondary | ICD-10-CM | POA: Diagnosis not present

## 2022-01-26 DIAGNOSIS — Z6831 Body mass index (BMI) 31.0-31.9, adult: Secondary | ICD-10-CM | POA: Diagnosis not present

## 2022-01-31 ENCOUNTER — Other Ambulatory Visit (HOSPITAL_COMMUNITY): Payer: Self-pay

## 2022-02-07 ENCOUNTER — Other Ambulatory Visit (HOSPITAL_COMMUNITY): Payer: Self-pay

## 2022-02-22 ENCOUNTER — Other Ambulatory Visit (HOSPITAL_COMMUNITY): Payer: Self-pay

## 2022-02-26 ENCOUNTER — Other Ambulatory Visit: Payer: Self-pay

## 2022-03-21 DIAGNOSIS — F329 Major depressive disorder, single episode, unspecified: Secondary | ICD-10-CM | POA: Diagnosis not present

## 2022-03-21 DIAGNOSIS — F419 Anxiety disorder, unspecified: Secondary | ICD-10-CM | POA: Diagnosis not present

## 2022-03-21 DIAGNOSIS — Z6830 Body mass index (BMI) 30.0-30.9, adult: Secondary | ICD-10-CM | POA: Diagnosis not present

## 2022-03-21 IMAGING — MR MR BREAST BX W/ LOC DEV 1ST LEASION IMAGE BX SPEC MR GUIDE*R*
9 of 12 series · 33 of 48 positions shown · IV contrast (8 ml gadavist)
Comparison: Previous exams.
COMPARISON: Previous exams.

Addendum:
CLINICAL DATA: 39-year-old female for tissue sampling of 0.7 cm
UPPER-OUTER RIGHT breast mass. Recent diagnosis of LEFT breast
cancer.

EXAM:
MRI GUIDED CORE NEEDLE BIOPSY OF THE RIGHT BREAST
TECHNIQUE: Multiplanar, multisequence MR imaging of the RIGHT breast was
performed both before and after administration of intravenous
contrast.
CONTRAST:  8mL GADAVIST GADOBUTROL 1 MMOL/ML IV SOLN

[Series 2: fiducial unilateral · sagittal · 2.0mm · 1.33mm/px · 3 of 52 slices shown]
[im 1/52]
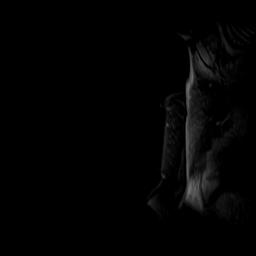
[im 26/52]
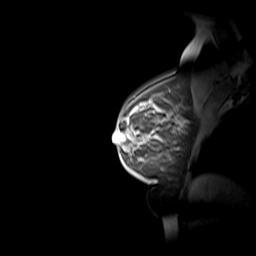
[im 52/52]
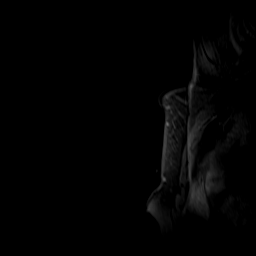

[Series 3: dynamic pre · axial · non-contrast · 1.3mm · 0.73mm/px · z∈[-69,+117]mm · 5 of 144 slices shown]
[im 1/144]
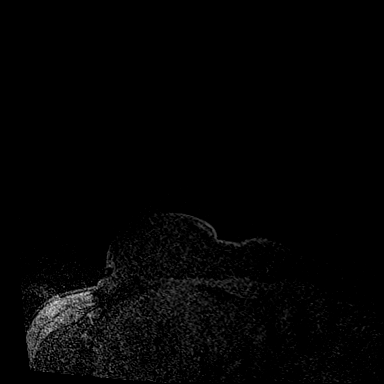
[im 36/144]
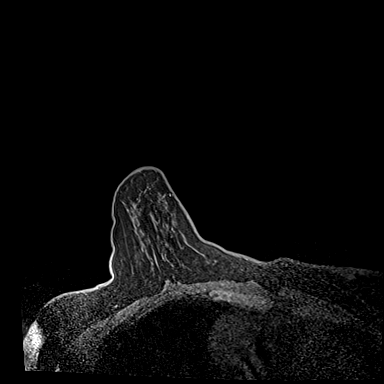
[im 72/144]
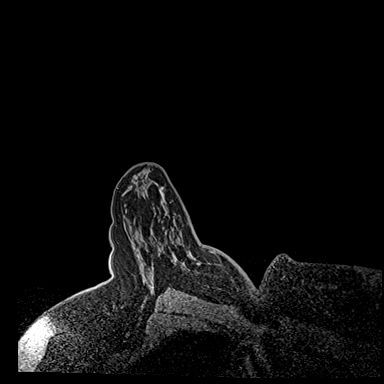
[im 108/144]
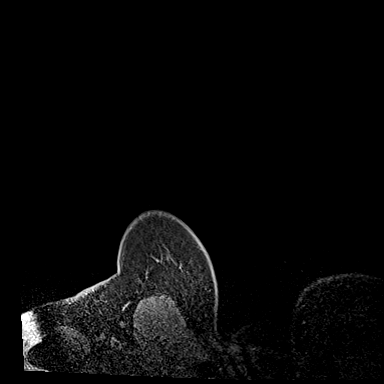
[im 144/144]
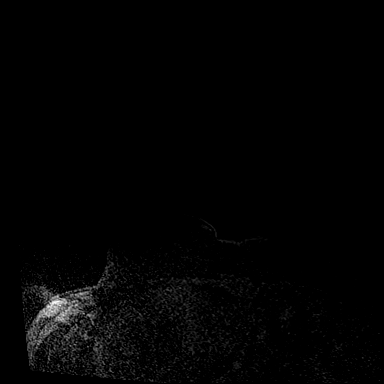

[Series 4: dynamic post 20 · axial · 1.3mm · 0.73mm/px · z∈[-69,+117]mm · 4 of 144 slices shown (1 of 2)]
[im 1/144]
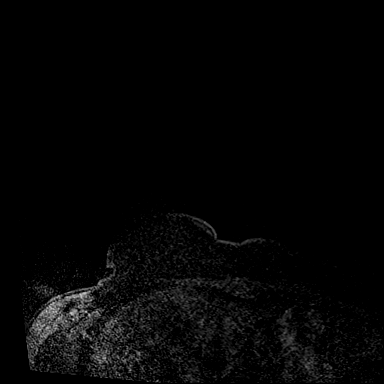
[im 48/144]
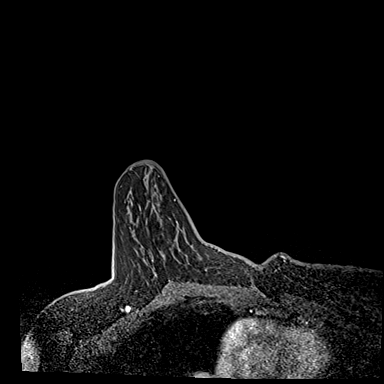
[im 96/144]
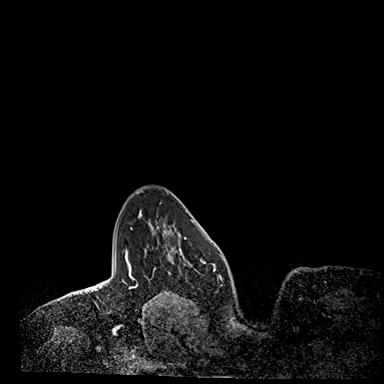
[im 144/144]
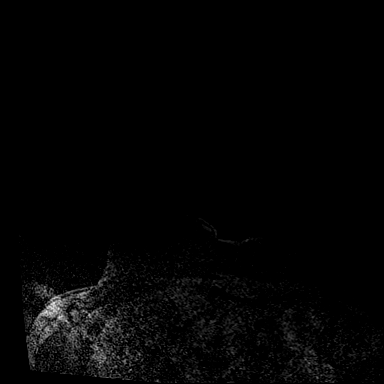

[Series 5: dynamic post 20 · axial · 1.3mm · 0.73mm/px · z∈[-69,+117]mm · 4 of 144 slices shown (2 of 2)]
[im 1/144]
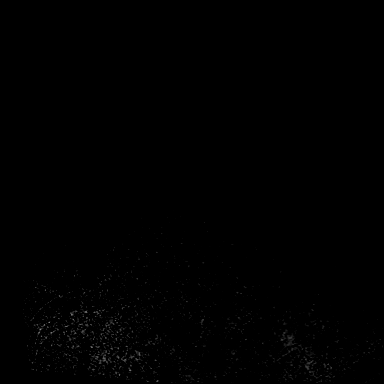
[im 48/144]
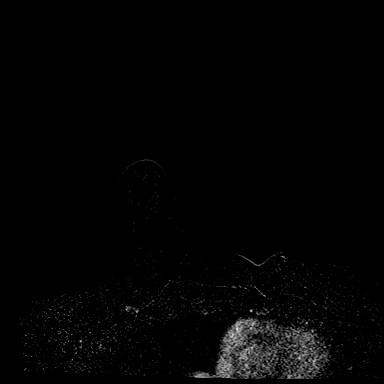
[im 96/144]
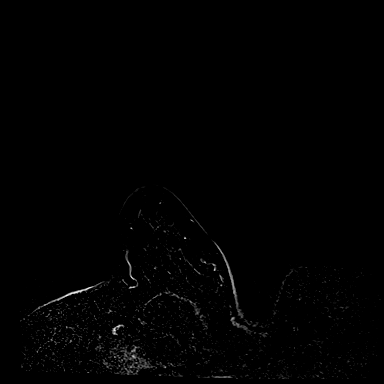
[im 144/144]
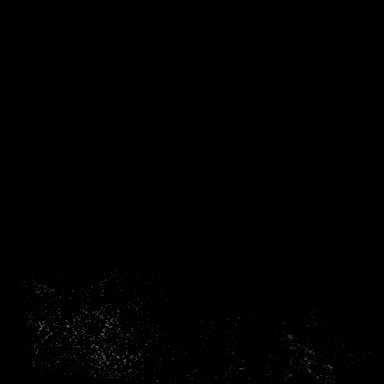

[Series 6: dynamic post 3 · axial · 1.3mm · 0.73mm/px · z∈[-69,+117]mm · 4 of 144 slices shown (1 of 2)]
[im 1/144]
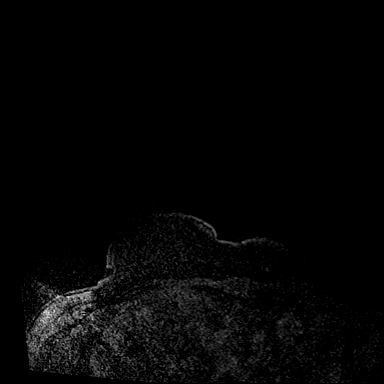
[im 48/144]
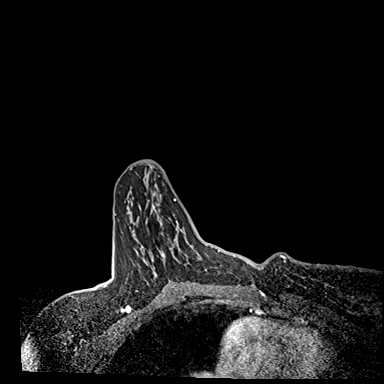
[im 96/144]
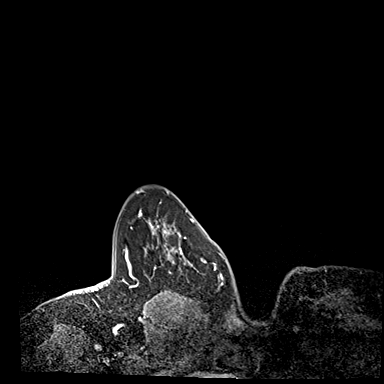
[im 144/144]
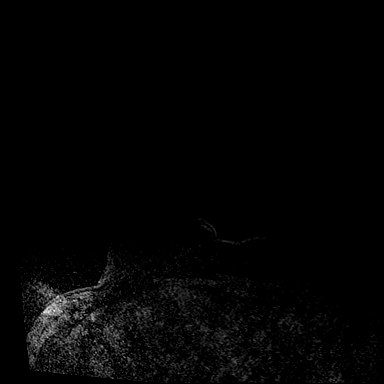

[Series 7: dynamic post 3 · axial · 1.3mm · 0.73mm/px · z∈[-69,+117]mm · 4 of 144 slices shown (2 of 2)]
[im 1/144]
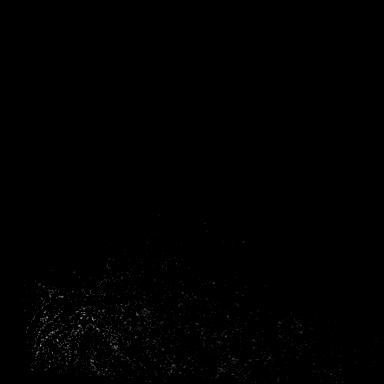
[im 48/144]
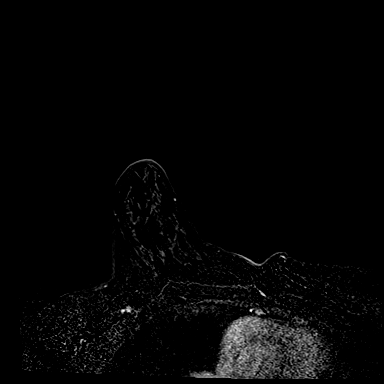
[im 96/144]
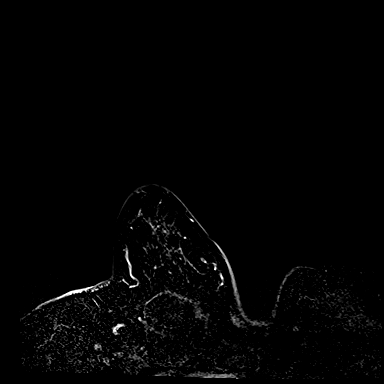
[im 144/144]
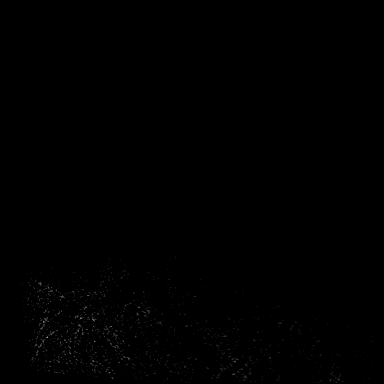

[Series 8: needle confirmation · axial · 1.3mm · 0.73mm/px · z∈[-69,+117]mm · 4 of 144 slices shown]
[im 1/144]
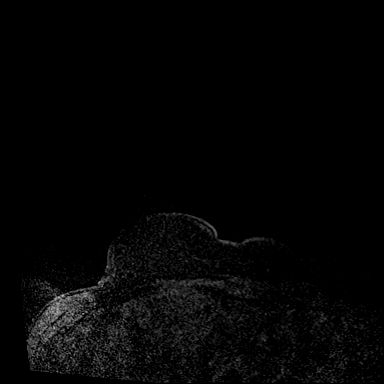
[im 48/144]
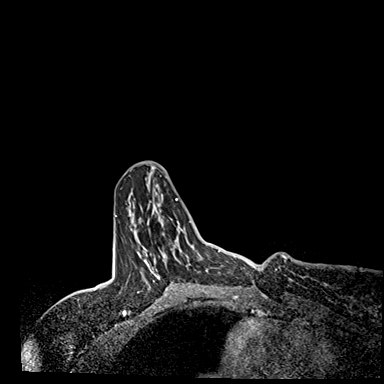
[im 96/144]
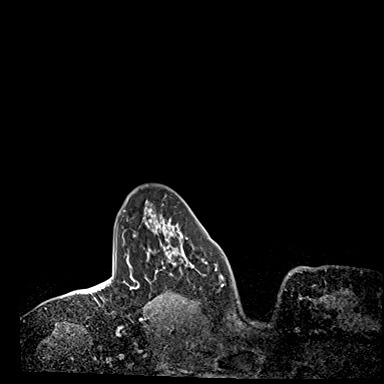
[im 144/144]
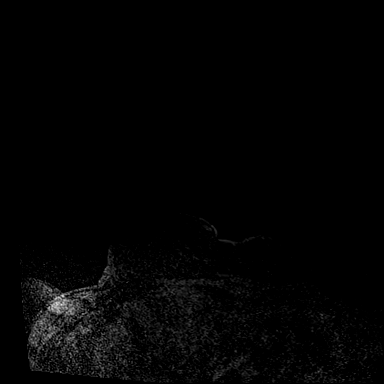

[Series 9: needle confirmation_sub · axial · 1.3mm · 0.73mm/px · z∈[-69,+117]mm · 4 of 141 slices shown]
[im 1/141]
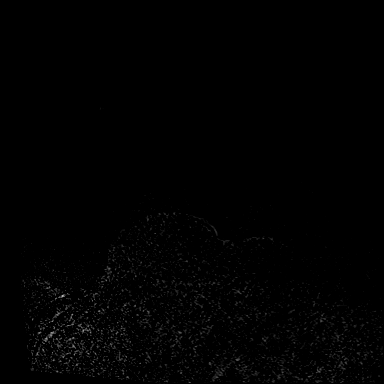
[im 47/141]
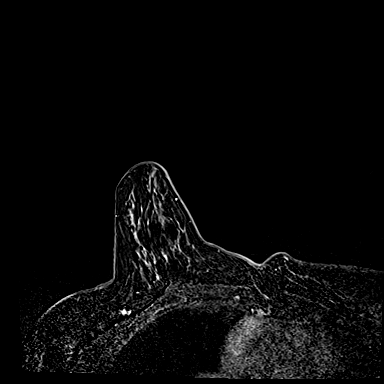
[im 94/141]
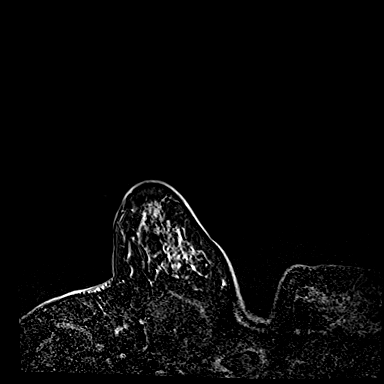
[im 141/141]
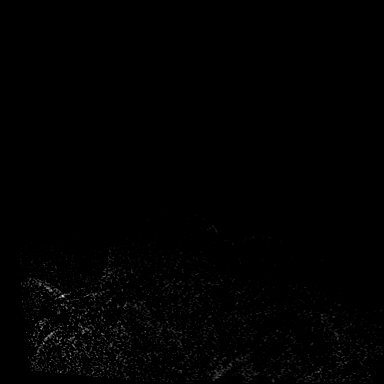

[Series 10: post clip · axial · 1.3mm · 0.73mm/px · 1 of 144 slices shown]
[im 1/144]
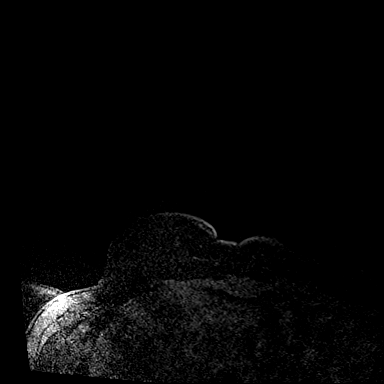

[33 of 48 positions shown; findings below may reference images not displayed]

FINDINGS: I met with the patient, and we discussed the procedure of MRI guided
biopsy, including risks, benefits, and alternatives. Specifically,
we discussed the risks of infection, bleeding, tissue injury, clip
migration, and inadequate sampling. Informed, written consent was
given. The usual time out protocol was performed immediately prior
to the procedure.

Using sterile technique, 1% Lidocaine, MRI guidance, and a 9 gauge
vacuum assisted device, biopsy was performed of the 0.7 cm mass
within the UPPER-OUTER RIGHT breast using a cyst LATERAL approach.
At the conclusion of the procedure, a BARBELL tissue marker clip was
deployed into the biopsy cavity. Follow-up 2-view mammogram was
performed and dictated separately.

Mild oozing at the skin entry site was noted and a quick clot device
was placed.
IMPRESSION: MRI guided biopsy of 0.7 cm UPPER-OUTER RIGHT breast mass. No
apparent complications.

ADDENDUM:
Pathology revealed FOCAL USUAL DUCTAL HYPERPLASIA AND FIBROCYSTIC
CHANGES WITH APOCRINE METAPLASIA, FRAGMENTS OF REACTIVE LYMPH NODE
of the RIGHT breast, 0.7 cm upper outer mass. This was found to be
concordant by Dr. Kelby Charlton.

Pathology results were discussed with the patient by telephone. The
patient reported doing well after the biopsy with tenderness,
bleeding and bruising at the site. Post biopsy instructions and care
were reviewed and questions were answered. The patient was
encouraged to call The [REDACTED] for any
additional concerns. My direct phone number was provided.

The patient has a recent diagnosis of LEFT breast cancer and should
follow her outlined treatment plan.

Dr. Don Lolito Onacram and Dr. Foyeke Sasha were notified of biopsy
results via [REDACTED] message on September 07, 2019.

Pathology results reported by Safwat Bradshaw, RN on 09/07/2019.

*** End of Addendum ***
FINDINGS: I met with the patient, and we discussed the procedure of MRI guided
biopsy, including risks, benefits, and alternatives. Specifically,
we discussed the risks of infection, bleeding, tissue injury, clip
migration, and inadequate sampling. Informed, written consent was
given. The usual time out protocol was performed immediately prior
to the procedure.

Using sterile technique, 1% Lidocaine, MRI guidance, and a 9 gauge
vacuum assisted device, biopsy was performed of the 0.7 cm mass
within the UPPER-OUTER RIGHT breast using a cyst LATERAL approach.
At the conclusion of the procedure, a BARBELL tissue marker clip was
deployed into the biopsy cavity. Follow-up 2-view mammogram was
performed and dictated separately.

Mild oozing at the skin entry site was noted and a quick clot device
was placed.
IMPRESSION: MRI guided biopsy of 0.7 cm UPPER-OUTER RIGHT breast mass. No
apparent complications.

## 2022-03-28 DIAGNOSIS — D0512 Intraductal carcinoma in situ of left breast: Secondary | ICD-10-CM | POA: Diagnosis not present

## 2022-04-04 DIAGNOSIS — F419 Anxiety disorder, unspecified: Secondary | ICD-10-CM | POA: Diagnosis not present

## 2022-04-08 DIAGNOSIS — F411 Generalized anxiety disorder: Secondary | ICD-10-CM | POA: Diagnosis not present

## 2022-04-10 DIAGNOSIS — F419 Anxiety disorder, unspecified: Secondary | ICD-10-CM | POA: Diagnosis not present

## 2022-04-11 ENCOUNTER — Other Ambulatory Visit (HOSPITAL_COMMUNITY): Payer: Self-pay

## 2022-04-11 ENCOUNTER — Other Ambulatory Visit: Payer: Self-pay

## 2022-04-11 MED ORDER — WEGOVY 1 MG/0.5ML ~~LOC~~ SOAJ
1.0000 mg | SUBCUTANEOUS | 0 refills | Status: AC
  Filled 2022-04-11 – 2022-04-26 (×2): qty 2, 28d supply, fill #0

## 2022-04-11 MED ORDER — WEGOVY 0.5 MG/0.5ML ~~LOC~~ SOAJ
0.5000 mg | SUBCUTANEOUS | 0 refills | Status: DC
  Filled 2022-04-11: qty 2, 28d supply, fill #0

## 2022-04-18 DIAGNOSIS — F419 Anxiety disorder, unspecified: Secondary | ICD-10-CM | POA: Diagnosis not present

## 2022-04-23 DIAGNOSIS — Z9012 Acquired absence of left breast and nipple: Secondary | ICD-10-CM | POA: Diagnosis not present

## 2022-04-26 ENCOUNTER — Other Ambulatory Visit (HOSPITAL_COMMUNITY): Payer: Self-pay

## 2022-05-08 DIAGNOSIS — M170 Bilateral primary osteoarthritis of knee: Secondary | ICD-10-CM | POA: Diagnosis not present

## 2022-05-08 DIAGNOSIS — M19072 Primary osteoarthritis, left ankle and foot: Secondary | ICD-10-CM | POA: Diagnosis not present

## 2022-05-08 DIAGNOSIS — F411 Generalized anxiety disorder: Secondary | ICD-10-CM | POA: Diagnosis not present

## 2022-05-08 DIAGNOSIS — M25542 Pain in joints of left hand: Secondary | ICD-10-CM | POA: Diagnosis not present

## 2022-05-08 DIAGNOSIS — M2550 Pain in unspecified joint: Secondary | ICD-10-CM | POA: Diagnosis not present

## 2022-05-08 DIAGNOSIS — M19071 Primary osteoarthritis, right ankle and foot: Secondary | ICD-10-CM | POA: Diagnosis not present

## 2022-05-08 DIAGNOSIS — R768 Other specified abnormal immunological findings in serum: Secondary | ICD-10-CM | POA: Diagnosis not present

## 2022-05-08 DIAGNOSIS — M25541 Pain in joints of right hand: Secondary | ICD-10-CM | POA: Diagnosis not present

## 2022-05-15 ENCOUNTER — Other Ambulatory Visit (HOSPITAL_COMMUNITY): Payer: Self-pay

## 2022-05-15 MED ORDER — WEGOVY 0.5 MG/0.5ML ~~LOC~~ SOAJ
0.5000 mg | SUBCUTANEOUS | 0 refills | Status: AC
  Filled 2022-05-15 – 2022-10-12 (×4): qty 2, 28d supply, fill #0

## 2022-05-25 ENCOUNTER — Other Ambulatory Visit (HOSPITAL_COMMUNITY): Payer: Self-pay

## 2022-06-08 ENCOUNTER — Ambulatory Visit (HOSPITAL_BASED_OUTPATIENT_CLINIC_OR_DEPARTMENT_OTHER)
Admission: RE | Admit: 2022-06-08 | Discharge: 2022-06-08 | Disposition: A | Discharge status: Home / Self Care | Payer: Commercial Managed Care - PPO | Source: Ambulatory Visit | Attending: Medical | Admitting: Medical

## 2022-06-08 ENCOUNTER — Other Ambulatory Visit (HOSPITAL_BASED_OUTPATIENT_CLINIC_OR_DEPARTMENT_OTHER): Payer: Self-pay | Admitting: Medical

## 2022-06-08 DIAGNOSIS — M79662 Pain in left lower leg: Secondary | ICD-10-CM

## 2022-06-08 DIAGNOSIS — M79661 Pain in right lower leg: Secondary | ICD-10-CM

## 2022-06-08 DIAGNOSIS — M79604 Pain in right leg: Secondary | ICD-10-CM

## 2022-06-08 DIAGNOSIS — M79605 Pain in left leg: Secondary | ICD-10-CM | POA: Diagnosis not present

## 2022-09-14 DIAGNOSIS — M2550 Pain in unspecified joint: Secondary | ICD-10-CM | POA: Diagnosis not present

## 2022-09-14 DIAGNOSIS — Z5181 Encounter for therapeutic drug level monitoring: Secondary | ICD-10-CM | POA: Diagnosis not present

## 2022-09-14 DIAGNOSIS — I471 Supraventricular tachycardia, unspecified: Secondary | ICD-10-CM | POA: Diagnosis not present

## 2022-09-14 DIAGNOSIS — N924 Excessive bleeding in the premenopausal period: Secondary | ICD-10-CM | POA: Diagnosis not present

## 2022-09-14 DIAGNOSIS — Z79810 Long term (current) use of selective estrogen receptor modulators (SERMs): Secondary | ICD-10-CM | POA: Diagnosis not present

## 2022-09-14 DIAGNOSIS — K429 Umbilical hernia without obstruction or gangrene: Secondary | ICD-10-CM | POA: Diagnosis not present

## 2022-09-14 DIAGNOSIS — D500 Iron deficiency anemia secondary to blood loss (chronic): Secondary | ICD-10-CM | POA: Diagnosis not present

## 2022-09-14 DIAGNOSIS — D0512 Intraductal carcinoma in situ of left breast: Secondary | ICD-10-CM | POA: Diagnosis not present

## 2022-09-14 DIAGNOSIS — R768 Other specified abnormal immunological findings in serum: Secondary | ICD-10-CM | POA: Diagnosis not present

## 2022-09-25 DIAGNOSIS — R300 Dysuria: Secondary | ICD-10-CM | POA: Diagnosis not present

## 2022-10-01 ENCOUNTER — Other Ambulatory Visit (HOSPITAL_COMMUNITY): Payer: Self-pay

## 2022-10-12 ENCOUNTER — Other Ambulatory Visit (HOSPITAL_COMMUNITY): Payer: Self-pay

## 2022-10-18 ENCOUNTER — Other Ambulatory Visit: Payer: Self-pay | Admitting: Family Medicine

## 2022-10-18 DIAGNOSIS — R109 Unspecified abdominal pain: Secondary | ICD-10-CM

## 2022-11-01 ENCOUNTER — Ambulatory Visit
Admission: RE | Admit: 2022-11-01 | Discharge: 2022-11-01 | Disposition: A | Discharge status: Home / Self Care | Payer: Managed Care, Other (non HMO) | Source: Ambulatory Visit | Attending: Family Medicine | Admitting: Family Medicine

## 2022-11-01 DIAGNOSIS — R109 Unspecified abdominal pain: Secondary | ICD-10-CM
